# Patient Record
Sex: Male | Born: 1990 | Race: Black or African American | Hispanic: No | Marital: Single | Smoking: Never smoker
Health system: Southern US, Community
[De-identification: ages and names within clinical notes are randomized; demographics above are authoritative.]

## PROBLEM LIST (undated history)

## (undated) DIAGNOSIS — C801 Malignant (primary) neoplasm, unspecified: Secondary | ICD-10-CM

## (undated) DIAGNOSIS — R221 Localized swelling, mass and lump, neck: Secondary | ICD-10-CM

## (undated) HISTORY — DX: Localized swelling, mass and lump, neck: R22.1

---

## 2014-08-24 ENCOUNTER — Ambulatory Visit: Payer: Self-pay | Admitting: Family Medicine

## 2014-09-22 ENCOUNTER — Ambulatory Visit
Admit: 2014-09-22 | Disposition: A | Discharge status: Home / Self Care | Payer: Self-pay | Attending: Oncology | Admitting: Oncology

## 2014-10-06 ENCOUNTER — Ambulatory Visit
Admit: 2014-10-06 | Disposition: A | Discharge status: Home / Self Care | Payer: Self-pay | Attending: Family Medicine | Admitting: Family Medicine

## 2014-10-07 ENCOUNTER — Ambulatory Visit
Admit: 2014-10-07 | Disposition: A | Discharge status: Home / Self Care | Payer: Self-pay | Attending: Oncology | Admitting: Oncology

## 2014-10-11 LAB — COMPREHENSIVE METABOLIC PANEL
Albumin: 4.1 g/dL
Alkaline Phosphatase: 86 U/L
Anion Gap: 8 (ref 7–16)
BUN: 11 mg/dL
Bilirubin,Total: 0.4 mg/dL
Calcium, Total: 9.5 mg/dL
Chloride: 102 mmol/L
Co2: 29 mmol/L
Creatinine: 0.89 mg/dL
EGFR (African American): >60
EGFR (Non-African Amer.): >60
Glucose: 100 mg/dL — ABNORMAL HIGH
Potassium: 4.3 mmol/L
SGOT(AST): 15 U/L
SGPT (ALT): 18 U/L
Sodium: 139 mmol/L
Total Protein: 9 g/dL — ABNORMAL HIGH

## 2014-10-11 LAB — LACTATE DEHYDROGENASE: LDH: 120 U/L

## 2014-10-11 LAB — CBC CANCER CENTER
Basophil #: 0 x10 3/mm (ref 0.0–0.1)
Basophil %: 0.4 %
Eosinophil #: 0.1 x10 3/mm (ref 0.0–0.7)
Eosinophil %: 1.2 %
HCT: 35 % — ABNORMAL LOW (ref 40.0–52.0)
HGB: 11.4 g/dL — ABNORMAL LOW (ref 13.0–18.0)
Lymphocyte #: 1.1 x10 3/mm (ref 1.0–3.6)
Lymphocyte %: 12.7 %
MCH: 23.6 pg — ABNORMAL LOW (ref 26.0–34.0)
MCHC: 32.6 g/dL (ref 32.0–36.0)
MCV: 73 fL — ABNORMAL LOW (ref 80–100)
Monocyte #: 0.6 x10 3/mm (ref 0.2–1.0)
Monocyte %: 6.9 %
Neutrophil #: 6.6 x10 3/mm — ABNORMAL HIGH (ref 1.4–6.5)
Neutrophil %: 78.8 %
Platelet: 317 x10 3/mm (ref 150–440)
RBC: 4.82 10*6/uL (ref 4.40–5.90)
RDW: 15.2 % — ABNORMAL HIGH (ref 11.5–14.5)
WBC: 8.4 x10 3/mm (ref 3.8–10.6)

## 2014-10-11 LAB — PROTIME-INR
INR: 1.2
Prothrombin Time: 15.6 secs — ABNORMAL HIGH

## 2014-10-11 LAB — APTT: Activated PTT: 40.3 secs — ABNORMAL HIGH (ref 23.6–35.9)

## 2014-10-13 ENCOUNTER — Ambulatory Visit
Admit: 2014-10-13 | Disposition: A | Discharge status: Home / Self Care | Payer: Self-pay | Attending: Unknown Physician Specialty | Admitting: Unknown Physician Specialty

## 2014-10-16 ENCOUNTER — Ambulatory Visit
Admit: 2014-10-16 | Disposition: A | Discharge status: Home / Self Care | Payer: Self-pay | Attending: Oncology | Admitting: Oncology

## 2014-10-20 LAB — SURGICAL PATHOLOGY

## 2014-10-25 ENCOUNTER — Encounter: Payer: Self-pay | Admitting: Oncology

## 2014-10-25 ENCOUNTER — Inpatient Hospital Stay: Payer: Commercial Indemnity | Attending: Oncology | Admitting: Oncology

## 2014-10-25 ENCOUNTER — Ambulatory Visit: Payer: Self-pay | Admitting: Oncology

## 2014-10-25 ENCOUNTER — Other Ambulatory Visit: Payer: Self-pay | Admitting: Oncology

## 2014-10-25 VITALS — BP 147/73 | HR 61 | Temp 96.9°F | Resp 18 | Wt 306.2 lb

## 2014-10-25 DIAGNOSIS — Z79899 Other long term (current) drug therapy: Secondary | ICD-10-CM | POA: Diagnosis not present

## 2014-10-25 DIAGNOSIS — C8191 Hodgkin lymphoma, unspecified, lymph nodes of head, face, and neck: Secondary | ICD-10-CM | POA: Diagnosis not present

## 2014-10-25 DIAGNOSIS — R0602 Shortness of breath: Secondary | ICD-10-CM

## 2014-10-25 DIAGNOSIS — Z5111 Encounter for antineoplastic chemotherapy: Secondary | ICD-10-CM | POA: Diagnosis not present

## 2014-10-25 DIAGNOSIS — C8190 Hodgkin lymphoma, unspecified, unspecified site: Secondary | ICD-10-CM

## 2014-10-26 ENCOUNTER — Other Ambulatory Visit: Payer: Self-pay | Admitting: Oncology

## 2014-10-26 DIAGNOSIS — C8190 Hodgkin lymphoma, unspecified, unspecified site: Secondary | ICD-10-CM

## 2014-10-27 NOTE — Progress Notes (Signed)
[place]  [phone] [phone]  ID: Nicholas Ballard. OB: [birth year]  MR#: [id number]  JTT#:[id number]  Patient Care Team: No Pcp Per Patient as PCP - General (General Practice)  CHIEF COMPLAINT:  Chief Complaint  Patient presents with  . Follow-up    neck mass  . Results    INTERVAL HISTORY: Patient returns to clinic today for further evaluation and discussion of his pathology results. He feels the mass on his neck has decreased in size, but is still mildly tender.  He has no neurologic complaints. He denies any fevers, chills, night sweats, or weight loss. He denies any difficulty swallowing or dysphagia. He has no chest pain, shortness of breath, or cough. He denies any nausea, vomiting, constipation, or diarrhea. He has no urinary complaints. Patient otherwise feels well and offers no further specific complaints.   REVIEW OF SYSTEMS:   Review of Systems  Constitutional: Negative.   HENT:       Denies dysphagia.  Respiratory: Negative.     As per HPI. Otherwise, a complete review of systems is negatve.  PAST MEDICAL HISTORY: Past Medical History  Diagnosis Date  . Neck mass     right     PAST SURGICAL HISTORY: No past surgical history on file.  FAMILY HISTORY Family History  Problem Relation Age of Onset  . Cancer Paternal Grandfather     head and neck cancer  . Diabetes Other   . Hypertension Other   . Cancer Other     history of prostate and kidney cancer       ADVANCED DIRECTIVES:    HEALTH MAINTENANCE: History  Substance Use Topics  . Smoking status: Never Smoker   . Smokeless tobacco: Never Used  . Alcohol Use: No     Colonoscopy:  PAP:  Bone density:  Lipid panel:  No Known Allergies  Current Outpatient Prescriptions  Medication Sig Dispense Refill  . acetaminophen (TYLENOL) 500 MG tablet Take 500 mg by mouth every 6 (six) hours as needed.    Marland Kitchen HYDROcodone-acetaminophen (NORCO/VICODIN)  5-325 MG per tablet Take 1 tablet by mouth every 6 (six) hours as needed for moderate pain.     No current facility-administered medications for this visit.    OBJECTIVE: Filed Vitals:   10/25/14 1202  BP: 147/73  Pulse: 61  Temp: 96.9 F (36.1 C)  Resp: 18     Body mass index is 41.48 kg/(m^2).    ECOG FS:0 - Asymptomatic  General: Well-developed, well-nourished, no acute distress. Eyes: Pink conjunctiva, anicteric sclera. HEENT: Easily palpable right neck/supraclavicular mass. Lungs: Clear to auscultation bilaterally. Heart: Regular rate and rhythm. No rubs, murmurs, or gallops. Abdomen: Soft, nontender, nondistended. No organomegaly noted, normoactive bowel sounds. Musculoskeletal: No edema, cyanosis, or clubbing. Neuro: Alert, answering all questions appropriately. Cranial nerves grossly intact. Skin: No rashes or petechiae noted. Psych: Normal affect.    LAB RESULTS:  Lab Results  Component Value Date   NA 139 10/11/2014   K 4.3 10/11/2014   CL 102 10/11/2014   CO2 29 10/11/2014   GLUCOSE 100* 10/11/2014   BUN 11 10/11/2014   CREATININE 0.89 10/11/2014   CALCIUM 9.5 10/11/2014   PROT 9.0* 10/11/2014   ALBUMIN 4.1 10/11/2014   AST 15 10/11/2014   ALT 18 10/11/2014   ALKPHOS 86 10/11/2014   GFRNONAA >60 10/11/2014   GFRAA >60 10/11/2014    Lab Results  Component Value Date   WBC 8.4 10/11/2014   NEUTROABS  6.6* 10/11/2014   HGB 11.4* 10/11/2014   HCT 35.0* 10/11/2014   MCV 73* 10/11/2014   PLT 317 10/11/2014     STUDIES: Nm Pet Image Initial (pi) Skull Base To Thigh  10/16/2014   CLINICAL DATA:  Initial treatment strategy for cervical/thoracic adenopathy. Initial staging for lymphoma.  EXAM: NUCLEAR MEDICINE PET SKULL BASE TO THIGH  TECHNIQUE: 12.2 mCi F-18 FDG was injected intravenously. Full-ring PET imaging was performed from the skull base to thigh after the radiotracer. CT data was obtained and used for attenuation correction and anatomic  localization.  FASTING BLOOD GLUCOSE:  Value: 81 mg/dl  COMPARISON:  Neck CT 10/06/2014.  Chest CT 10/06/2014.  FINDINGS: Mild degradation secondary to patient body habitus.  NECK  Bilateral low cervical nodal hypermetabolism. Hypermetabolism corresponding to the dominant mass at the junction of the low right neck and upper right chest. This measures 7.4 x 9.7 cm and a S.U.V. max of 18.8 on image 66.  CHEST  Mediastinal adenopathy, with an index right paratracheal node measuring 2.3 cm and a S.U.V. max of 11.6.  ABDOMEN/PELVIS  No abdominal nodal hypermetabolism. No abnormal activity within the spleen.  Bilateral mildly prominent inguinal nodes are favored to be reactive. Example left inguinal node which measures 8 mm and a S.U.V. max of 2.3 on image 291.  SKELETON  No abnormal marrow activity.  CT IMAGES PERFORMED FOR ATTENUATION CORRECTION  Mild mucosal thickening of the left maxillary sinus. Mild cardiomegaly.  IMPRESSION: 1. Active lymphoma within the neck and chest, as detailed above. 2. No convincing evidence of subdiaphragmatic disease. Bilateral prominent inguinal nodes with low-level hypermetabolism are favored to be reactive. 3. Mild degradation secondary to patient body habitus.   Electronically Signed   By: Abigail Miyamoto M.D.   On: 10/16/2014 12:44   US Thyroid Biopsy  10/13/2014   CLINICAL DATA:  Right supraclavicular mass.  EXAM: ULTRASOUND GUIDED core BIOPSY OF right supraclavicular mass.  MEDICATIONS: 2.0 mg IV Versed; 50 mcg IV Fentanyl  Total Moderate Sedation Time: 30 minutes.  PROCEDURE: The procedure, risks, benefits, and alternatives were explained to the patient. Questions regarding the procedure were encouraged and answered. The patient understands and consents to the procedure.  The right supraclavicular region was prepped with chlorhexidine in a sterile fashion, and a sterile drape was applied covering the operative field. Sterile gloves were used for the procedure. Local anesthesia was  provided with 1% Lidocaine.  It should be noted, under ultrasound this mass is very ill-defined and exact margins are not readily apparent. Under real-time ultrasound guidance, 3 core samples were obtained using 18 gauge needle and delivered to pathology as touch preps. Then, 3 more core samples were obtained and placed in formalin vial and delivered to pathology. Needle was removed an hemostasis was achieved with manual compression. Appropriate dressing was applied.  COMPLICATIONS: None immediate.  FINDINGS: Complex heterogeneous mass is noted in right supraclavicular region which is somewhat ill-defined on ultrasound.  IMPRESSION: Under real-time ultrasound guidance, percutaneous biopsy of right supraclavicular mass was performed.   Electronically Signed   By: Marijo Conception, M.D.   On: 10/13/2014 09:26    ASSESSMENT: Stage II Hodgkin's lymphoma.  PLAN:    1. Hodgkin's lymphoma: PET scan results as above and reviewed independently. Patient does not have any disease below his diaphragm. We will get a bone marrow biopsy to complete the staging workup. Patient will also require a MUGA as well as PFTs prior to starting chemotherapy. Finally, patient will  require port placement. Return to clinic in approximately 2-3 weeks to initiate cycle 1 of 12 of ABVD chemotherapy.  Approximately 30 minutes was spent in discussion and consultation.  Patient expressed understanding and was in agreement with this plan. He also understands that He can call clinic at any time with any questions, concerns, or complaints.   No matching staging information was found for the patient.  Lloyd Huger, MD   10/27/2014 5:05 PM

## 2014-10-30 ENCOUNTER — Ambulatory Visit: Payer: Managed Care, Other (non HMO) | Attending: Oncology

## 2014-10-30 DIAGNOSIS — R0602 Shortness of breath: Secondary | ICD-10-CM

## 2014-10-30 DIAGNOSIS — C8190 Hodgkin lymphoma, unspecified, unspecified site: Secondary | ICD-10-CM | POA: Diagnosis present

## 2014-11-03 ENCOUNTER — Ambulatory Visit
Admission: RE | Admit: 2014-11-03 | Discharge: 2014-11-03 | Disposition: A | Discharge status: Home / Self Care | Payer: Managed Care, Other (non HMO) | Source: Ambulatory Visit | Attending: Oncology | Admitting: Oncology

## 2014-11-03 DIAGNOSIS — C8190 Hodgkin lymphoma, unspecified, unspecified site: Secondary | ICD-10-CM

## 2014-11-03 HISTORY — DX: Malignant (primary) neoplasm, unspecified: C80.1

## 2014-11-03 MED ORDER — TECHNETIUM TC 99M-LABELED RED BLOOD CELLS IV KIT
20.3200 | PACK | Freq: Once | INTRAVENOUS | Status: AC | PRN
  Administered 2014-11-03: 20.32 via INTRAVENOUS

## 2014-11-03 NOTE — Patient Instructions (Signed)
Doxorubicin injection What is this medicine? DOXORUBICIN (dox oh ROO bi sin) is a chemotherapy drug. It is used to treat many kinds of cancer like Hodgkin's disease, leukemia, non-Hodgkin's lymphoma, neuroblastoma, sarcoma, and Wilms' tumor. It is also used to treat bladder cancer, breast cancer, lung cancer, ovarian cancer, stomach cancer, and thyroid cancer. This medicine may be used for other purposes; ask your health care provider or pharmacist if you have questions. COMMON BRAND NAME(S): Adriamycin, Adriamycin PFS, Adriamycin RDF, Rubex What should I tell my health care provider before I take this medicine? They need to know if you have any of these conditions: -blood disorders -heart disease, recent heart attack -infection (especially a virus infection such as chickenpox, cold sores, or herpes) -irregular heartbeat -liver disease -recent or ongoing radiation therapy -an unusual or allergic reaction to doxorubicin, other chemotherapy agents, other medicines, foods, dyes, or preservatives -pregnant or trying to get pregnant -breast-feeding How should I use this medicine? This drug is given as an infusion into a vein. It is administered in a hospital or clinic by a specially trained health care professional. If you have pain, swelling, burning or any unusual feeling around the site of your injection, tell your health care professional right away. Talk to your pediatrician regarding the use of this medicine in children. Special care may be needed. Overdosage: If you think you have taken too much of this medicine contact a poison control center or emergency room at once. NOTE: This medicine is only for you. Do not share this medicine with others. What if I miss a dose? It is important not to miss your dose. Call your doctor or health care professional if you are unable to keep an appointment. What may interact with this medicine? Do not take this medicine with any of the following  medications: -cisapride -droperidol -halofantrine -pimozide -zidovudine This medicine may also interact with the following medications: -chloroquine -chlorpromazine -clarithromycin -cyclophosphamide -cyclosporine -erythromycin -medicines for depression, anxiety, or psychotic disturbances -medicines for irregular heart beat like amiodarone, bepridil, dofetilide, encainide, flecainide, propafenone, quinidine -medicines for seizures like ethotoin, fosphenytoin, phenytoin -medicines for nausea, vomiting like dolasetron, ondansetron, palonosetron -medicines to increase blood counts like filgrastim, pegfilgrastim, sargramostim -methadone -methotrexate -pentamidine -progesterone -vaccines -verapamil Talk to your doctor or health care professional before taking any of these medicines: -acetaminophen -aspirin -ibuprofen -ketoprofen -naproxen This list may not describe all possible interactions. Give your health care provider a list of all the medicines, herbs, non-prescription drugs, or dietary supplements you use. Also tell them if you smoke, drink alcohol, or use illegal drugs. Some items may interact with your medicine. What should I watch for while using this medicine? Your condition will be monitored carefully while you are receiving this medicine. You will need important blood work done while you are taking this medicine. This drug may make you feel generally unwell. This is not uncommon, as chemotherapy can affect healthy cells as well as cancer cells. Report any side effects. Continue your course of treatment even though you feel ill unless your doctor tells you to stop. Your urine may turn red for a few days after your dose. This is not blood. If your urine is dark or brown, call your doctor. In some cases, you may be given additional medicines to help with side effects. Follow all directions for their use. Call your doctor or health care professional for advice if you get a  fever, chills or sore throat, or other symptoms of a cold or flu. Do not   treat yourself. This drug decreases your body's ability to fight infections. Try to avoid being around people who are sick. This medicine may increase your risk to bruise or bleed. Call your doctor or health care professional if you notice any unusual bleeding. Be careful brushing and flossing your teeth or using a toothpick because you may get an infection or bleed more easily. If you have any dental work done, tell your dentist you are receiving this medicine. Avoid taking products that contain aspirin, acetaminophen, ibuprofen, naproxen, or ketoprofen unless instructed by your doctor. These medicines may hide a fever. Men and women of childbearing age should use effective birth control methods while using taking this medicine. Do not become pregnant while taking this medicine. There is a potential for serious side effects to an unborn child. Talk to your health care professional or pharmacist for more information. Do not breast-feed an infant while taking this medicine. Do not let others touch your urine or other body fluids for 5 days after each treatment with this medicine. Caregivers should wear latex gloves to avoid touching body fluids during this time. There is a maximum amount of this medicine you should receive throughout your life. The amount depends on the medical condition being treated and your overall health. Your doctor will watch how much of this medicine you receive in your lifetime. Tell your doctor if you have taken this medicine before. What side effects may I notice from receiving this medicine? Side effects that you should report to your doctor or health care professional as soon as possible: -allergic reactions like skin rash, itching or hives, swelling of the face, lips, or tongue -low blood counts - this medicine may decrease the number of white blood cells, red blood cells and platelets. You may be at  increased risk for infections and bleeding. -signs of infection - fever or chills, cough, sore throat, pain or difficulty passing urine -signs of decreased platelets or bleeding - bruising, pinpoint red spots on the skin, black, tarry stools, blood in the urine -signs of decreased red blood cells - unusually weak or tired, fainting spells, lightheadedness -breathing problems -chest pain -fast, irregular heartbeat -mouth sores -nausea, vomiting -pain, swelling, redness at site where injected -pain, tingling, numbness in the hands or feet -swelling of ankles, feet, or hands -unusual bleeding or bruising Side effects that usually do not require medical attention (report to your doctor or health care professional if they continue or are bothersome): -diarrhea -facial flushing -hair loss -loss of appetite -missed menstrual periods -nail discoloration or damage -red or watery eyes -red colored urine -stomach upset This list may not describe all possible side effects. Call your doctor for medical advice about side effects. You may report side effects to FDA at 1-800-FDA-1088. Where should I keep my medicine? This drug is given in a hospital or clinic and will not be stored at home. NOTE: This sheet is a summary. It may not cover all possible information. If you have questions about this medicine, talk to your doctor, pharmacist, or health care provider.  2015, [employer]. (2012-10-05 09:54:34) Bleomycin injection What is this medicine? BLEOMYCIN (blee oh MYE sin) is a chemotherapy drug. It is used to treat many kinds of cancer like lymphoma, cervical cancer, head and neck cancer, and testicular cancer. It is also used to prevent and to treat fluid build-up around the lungs caused by some cancers. This medicine may be used for other purposes; ask your health care provider or pharmacist if  you have questions. COMMON BRAND NAME(S): Blenoxane What should I tell my health care  provider before I take this medicine? They need to know if you have any of these conditions: -cigarette smoker -kidney disease -lung disease -recent or ongoing radiation therapy -an unusual or allergic reaction to bleomycin, other chemotherapy agents, other medicines, foods, dyes, or preservatives -pregnant or trying to get pregnant -breast-feeding How should I use this medicine? This drug is given as an infusion into a vein or a body cavity. It can also be given as an injection into a muscle or under the skin. It is administered in a hospital or clinic by a specially trained health care professional. Talk to your pediatrician regarding the use of this medicine in children. Special care may be needed. Overdosage: If you think you have taken too much of this medicine contact a poison control center or emergency room at once. NOTE: This medicine is only for you. Do not share this medicine with others. What if I miss a dose? It is important not to miss your dose. Call your doctor or health care professional if you are unable to keep an appointment. What may interact with this medicine? -certain antibiotics given by injection -cisplatin -cyclosporine -diuretics -foscarnet -medicines to increase blood counts like filgrastim, pegfilgrastim, sargramostim -vaccines This list may not describe all possible interactions. Give your health care provider a list of all the medicines, herbs, non-prescription drugs, or dietary supplements you use. Also tell them if you smoke, drink alcohol, or use illegal drugs. Some items may interact with your medicine. What should I watch for while using this medicine? Visit your doctor for checks on your progress. This drug may make you feel generally unwell. This is not uncommon, as chemotherapy can affect healthy cells as well as cancer cells. Report any side effects. Continue your course of treatment even though you feel ill unless your doctor tells you to  stop. Call your doctor or health care professional for advice if you get a fever, chills or sore throat, or other symptoms of a cold or flu. Do not treat yourself. This drug decreases your body's ability to fight infections. Try to avoid being around people who are sick. Avoid taking products that contain aspirin, acetaminophen, ibuprofen, naproxen, or ketoprofen unless instructed by your doctor. These medicines may hide a fever. Do not become pregnant while taking this medicine. Women should inform their doctor if they wish to become pregnant or think they might be pregnant. There is a potential for serious side effects to an unborn child. Talk to your health care professional or pharmacist for more information. Do not breast-feed an infant while taking this medicine. There is a maximum amount of this medicine you should receive throughout your life. The amount depends on the medical condition being treated and your overall health. Your doctor will watch how much of this medicine you receive in your lifetime. Tell your doctor if you have taken this medicine before. What side effects may I notice from receiving this medicine? Side effects that you should report to your doctor or health care professional as soon as possible: -allergic reactions like skin rash, itching or hives, swelling of the face, lips, or tongue -breathing problems -chest pain -confusion -cough -fast, irregular heartbeat -feeling faint or lightheaded, falls -fever or chills -mouth sores -pain, tingling, numbness in the hands or feet -trouble passing urine or change in the amount of urine -yellowing of the eyes or skin Side effects that usually do not  require medical attention (report to your doctor or health care professional if they continue or are bothersome): -darker skin color -hair loss -irritation at site where injected -loss of appetite -nail changes -nausea and vomiting -weight loss This list may not describe all  possible side effects. Call your doctor for medical advice about side effects. You may report side effects to FDA at 1-800-FDA-1088. Where should I keep my medicine? This drug is given in a hospital or clinic and will not be stored at home. NOTE: This sheet is a summary. It may not cover all possible information. If you have questions about this medicine, talk to your doctor, pharmacist, or health care provider.  2015, [employer]. (2012-10-05 09:36:48) Vinblastine injection What is this medicine? VINBLASTINE (vin BLAS teen) is a chemotherapy drug. It slows the growth of cancer cells. This medicine is used to treat many types of cancer like breast cancer, testicular cancer, Hodgkin's disease, non-Hodgkin's lymphoma, and sarcoma. This medicine may be used for other purposes; ask your health care provider or pharmacist if you have questions. COMMON BRAND NAME(S): Velban What should I tell my health care provider before I take this medicine? They need to know if you have any of these conditions: -blood disorders -dental disease -gout -infection (especially a virus infection such as chickenpox, cold sores, or herpes) -liver disease -lung disease -nervous system disease -recent or ongoing radiation therapy -an unusual or allergic reaction to vinblastine, other chemotherapy agents, other medicines, foods, dyes, or preservatives -pregnant or trying to get pregnant -breast-feeding How should I use this medicine? This drug is given as an infusion into a vein. It is administered in a hospital or clinic by a specially trained health care professional. If you have pain, swelling, burning or any unusual feeling around the site of your injection, tell your health care professional right away. Talk to your pediatrician regarding the use of this medicine in children. While this drug may be prescribed for selected conditions, precautions do apply. Overdosage: If you think you have taken too much  of this medicine contact a poison control center or emergency room at once. NOTE: This medicine is only for you. Do not share this medicine with others. What if I miss a dose? It is important not to miss your dose. Call your doctor or health care professional if you are unable to keep an appointment. What may interact with this medicine? Do not take this medicine with any of the following medications: -erythromycin -itraconazole -mibefradil -voriconazole This medicine may also interact with the following medications: -cyclosporine -fluconazole -ketoconazole -medicines for seizures like phenytoin -medicines to increase blood counts like filgrastim, pegfilgrastim, sargramostim -vaccines -verapamil Talk to your doctor or health care professional before taking any of these medicines: -acetaminophen -aspirin -ibuprofen -ketoprofen -naproxen This list may not describe all possible interactions. Give your health care provider a list of all the medicines, herbs, non-prescription drugs, or dietary supplements you use. Also tell them if you smoke, drink alcohol, or use illegal drugs. Some items may interact with your medicine. What should I watch for while using this medicine? Your condition will be monitored carefully while you are receiving this medicine. You will need important blood work done while you are taking this medicine. This drug may make you feel generally unwell. This is not uncommon, as chemotherapy can affect healthy cells as well as cancer cells. Report any side effects. Continue your course of treatment even though you feel ill unless your doctor tells you to stop. In  some cases, you may be given additional medicines to help with side effects. Follow all directions for their use. Call your doctor or health care professional for advice if you get a fever, chills or sore throat, or other symptoms of a cold or flu. Do not treat yourself. This drug decreases your body's ability to  fight infections. Try to avoid being around people who are sick. This medicine may increase your risk to bruise or bleed. Call your doctor or health care professional if you notice any unusual bleeding. Be careful brushing and flossing your teeth or using a toothpick because you may get an infection or bleed more easily. If you have any dental work done, tell your dentist you are receiving this medicine. Avoid taking products that contain aspirin, acetaminophen, ibuprofen, naproxen, or ketoprofen unless instructed by your doctor. These medicines may hide a fever. Do not become pregnant while taking this medicine. Women should inform their doctor if they wish to become pregnant or think they might be pregnant. There is a potential for serious side effects to an unborn child. Talk to your health care professional or pharmacist for more information. Do not breast-feed an infant while taking this medicine. Men may have a lower sperm count while taking this medicine. Talk to your doctor if you plan to father a child. What side effects may I notice from receiving this medicine? Side effects that you should report to your doctor or health care professional as soon as possible: -allergic reactions like skin rash, itching or hives, swelling of the face, lips, or tongue -low blood counts - This drug may decrease the number of white blood cells, red blood cells and platelets. You may be at increased risk for infections and bleeding. -signs of infection - fever or chills, cough, sore throat, pain or difficulty passing urine -signs of decreased platelets or bleeding - bruising, pinpoint red spots on the skin, black, tarry stools, nosebleeds -signs of decreased red blood cells - unusually weak or tired, fainting spells, lightheadedness -breathing problems -changes in hearing -change in the amount of urine -chest pain -high blood pressure -mouth sores -nausea and vomiting -pain, swelling, redness or irritation  at the injection site -pain, tingling, numbness in the hands or feet -problems with balance, dizziness -seizures Side effects that usually do not require medical attention (report to your doctor or health care professional if they continue or are bothersome): -constipation -hair loss -jaw pain -loss of appetite -sensitivity to light -stomach pain -tumor pain This list may not describe all possible side effects. Call your doctor for medical advice about side effects. You may report side effects to FDA at 1-800-FDA-1088. Where should I keep my medicine? This drug is given in a hospital or clinic and will not be stored at home. NOTE: This sheet is a summary. It may not cover all possible information. If you have questions about this medicine, talk to your doctor, pharmacist, or health care provider.  2015, [employer]. (2008-03-06 17:15:59) Dacarbazine, DTIC injection What is this medicine? DACARBAZINE (da KAR ba zeen) is a chemotherapy drug. This medicine is used to treat skin cancer. It is also used with other medicines to treat Hodgkin's disease. This medicine may be used for other purposes; ask your health care provider or pharmacist if you have questions. COMMON BRAND NAME(S): DTIC-Dome What should I tell my health care provider before I take this medicine? They need to know if you have any of these conditions: -infection (especially virus infection such as  chickenpox, cold sores, or herpes) -kidney disease -liver disease -low blood counts like low platelets, red blood cells, white blood cells -recent radiation therapy -an unusual or allergic reaction to dacarbazine, other chemotherapy agents, other medicines, foods, dyes, or preservatives -pregnant or trying to get pregnant -breast-feeding How should I use this medicine? This drug is given as an injection or infusion into a vein. It is administered in a hospital or clinic by a specially trained health care  professional. Talk to your pediatrician regarding the use of this medicine in children. While this drug may be prescribed for selected conditions, precautions do apply. Overdosage: If you think you have taken too much of this medicine contact a poison control center or emergency room at once. NOTE: This medicine is only for you. Do not share this medicine with others. What if I miss a dose? It is important not to miss your dose. Call your doctor or health care professional if you are unable to keep an appointment. What may interact with this medicine? -medicines to increase blood counts like filgrastim, pegfilgrastim, sargramostim -vaccines This list may not describe all possible interactions. Give your health care provider a list of all the medicines, herbs, non-prescription drugs, or dietary supplements you use. Also tell them if you smoke, drink alcohol, or use illegal drugs. Some items may interact with your medicine. What should I watch for while using this medicine? Your condition will be monitored carefully while you are receiving this medicine. You will need important blood work done while you are taking this medicine. This drug may make you feel generally unwell. This is not uncommon, as chemotherapy can affect healthy cells as well as cancer cells. Report any side effects. Continue your course of treatment even though you feel ill unless your doctor tells you to stop. Call your doctor or health care professional for advice if you get a fever, chills or sore throat, or other symptoms of a cold or flu. Do not treat yourself. This drug decreases your body's ability to fight infections. Try to avoid being around people who are sick. This medicine may increase your risk to bruise or bleed. Call your doctor or health care professional if you notice any unusual bleeding. Be careful brushing and flossing your teeth or using a toothpick because you may get an infection or bleed more easily. If you  have any dental work done, tell your dentist you are receiving this medicine. Avoid taking products that contain aspirin, acetaminophen, ibuprofen, naproxen, or ketoprofen unless instructed by your doctor. These medicines may hide a fever. Do not become pregnant while taking this medicine. Women should inform their doctor if they wish to become pregnant or think they might be pregnant. There is a potential for serious side effects to an unborn child. Talk to your health care professional or pharmacist for more information. Do not breast-feed an infant while taking this medicine. What side effects may I notice from receiving this medicine? Side effects that you should report to your doctor or health care professional as soon as possible: -allergic reactions like skin rash, itching or hives, swelling of the face, lips, or tongue -low blood counts - this medicine may decrease the number of white blood cells, red blood cells and platelets. You may be at increased risk for infections and bleeding. -signs of infection - fever or chills, cough, sore throat, pain or difficulty passing urine -signs of decreased platelets or bleeding - bruising, pinpoint red spots on the skin, black, tarry stools,  blood in the urine -signs of decreased red blood cells - unusually weak or tired, fainting spells, lightheadedness -breathing problems -muscle pains -pain at site where injected -trouble passing urine or change in the amount of urine -vomiting -yellowing of the eyes or skin Side effects that usually do not require medical attention (report to your doctor or health care professional if they continue or are bothersome): -diarrhea -hair loss -loss of appetite -nausea -skin more sensitive to sun or ultraviolet light -stomach upset This list may not describe all possible side effects. Call your doctor for medical advice about side effects. You may report side effects to FDA at 1-800-FDA-1088. Where should I keep  my medicine? This drug is given in a hospital or clinic and will not be stored at home. NOTE: This sheet is a summary. It may not cover all possible information. If you have questions about this medicine, talk to your doctor, pharmacist, or health care provider.  2015, [employer]. (2007-09-28 16:56:39)

## 2014-11-07 ENCOUNTER — Other Ambulatory Visit: Payer: Self-pay | Admitting: Radiology

## 2014-11-07 ENCOUNTER — Other Ambulatory Visit: Payer: Self-pay | Admitting: Oncology

## 2014-11-07 ENCOUNTER — Inpatient Hospital Stay: Payer: Commercial Indemnity

## 2014-11-07 DIAGNOSIS — C8190 Hodgkin lymphoma, unspecified, unspecified site: Secondary | ICD-10-CM | POA: Insufficient documentation

## 2014-11-08 ENCOUNTER — Other Ambulatory Visit: Payer: Self-pay | Admitting: *Deleted

## 2014-11-08 ENCOUNTER — Ambulatory Visit
Admission: RE | Admit: 2014-11-08 | Discharge: 2014-11-08 | Disposition: A | Discharge status: Home / Self Care | Payer: Managed Care, Other (non HMO) | Source: Ambulatory Visit | Attending: Oncology | Admitting: Oncology

## 2014-11-08 DIAGNOSIS — C8190 Hodgkin lymphoma, unspecified, unspecified site: Secondary | ICD-10-CM | POA: Insufficient documentation

## 2014-11-08 LAB — DIFFERENTIAL
BAND NEUTROPHILS: 0 % (ref 0–10)
Basophils Absolute: 0 10*3/uL (ref 0–0.1)
Basophils Relative: 0 %
Blasts: 0 %
EOS ABS: 0.3 10*3/uL (ref 0.0–0.7)
EOS PCT: 3 % (ref 0–5)
Lymphocytes Relative: 10 % — ABNORMAL LOW (ref 12–46)
Lymphs Abs: 1 10*3/uL (ref 0.7–4.0)
MONO ABS: 0.5 10*3/uL (ref 0.1–1.0)
MONOS PCT: 5 % (ref 3–12)
Metamyelocytes Relative: 0 %
Myelocytes: 0 %
NEUTROS ABS: 8.2 10*3/uL — AB (ref 1.7–7.7)
NEUTROS PCT: 82 % — AB (ref 43–77)
OTHER: 0 %
PROMYELOCYTES ABS: 0 %
nRBC: 0 /100 WBC

## 2014-11-08 LAB — APTT: APTT: 43 s — AB (ref 24–36)

## 2014-11-08 LAB — CBC
HCT: 32.7 % — ABNORMAL LOW (ref 40.0–52.0)
Hemoglobin: 10.3 g/dL — ABNORMAL LOW (ref 13.0–18.0)
MCH: 22.7 pg — AB (ref 26.0–34.0)
MCHC: 31.4 g/dL — ABNORMAL LOW (ref 32.0–36.0)
MCV: 72.3 fL — AB (ref 80.0–100.0)
Platelets: 302 10*3/uL (ref 150–440)
RBC: 4.52 MIL/uL (ref 4.40–5.90)
RDW: 15.6 % — ABNORMAL HIGH (ref 11.5–14.5)
WBC: 10 10*3/uL (ref 3.8–10.6)

## 2014-11-08 LAB — PROTIME-INR
INR: 1.2
Prothrombin Time: 15.4 seconds — ABNORMAL HIGH (ref 11.4–15.0)

## 2014-11-08 MED ORDER — MIDAZOLAM HCL 5 MG/5ML IJ SOLN
INTRAMUSCULAR | Status: AC
  Administered 2014-11-08: 1 mg
  Filled 2014-11-08: qty 10

## 2014-11-08 MED ORDER — FENTANYL CITRATE (PF) 100 MCG/2ML IJ SOLN
INTRAMUSCULAR | Status: AC
  Administered 2014-11-08: 100 ug
  Filled 2014-11-08: qty 2

## 2014-11-08 MED ORDER — FENTANYL CITRATE (PF) 100 MCG/2ML IJ SOLN
INTRAMUSCULAR | Status: AC
  Administered 2014-11-08: 50 ug
  Filled 2014-11-08: qty 2

## 2014-11-08 MED ORDER — HEPARIN SOD (PORK) LOCK FLUSH 100 UNIT/ML IV SOLN
500.0000 [IU] | Freq: Once | INTRAVENOUS | Status: AC
  Administered 2014-11-08: 500 [IU] via INTRAVENOUS
  Filled 2014-11-08: qty 5

## 2014-11-08 MED ORDER — HYDROCODONE-ACETAMINOPHEN 5-325 MG PO TABS: 1.0000 | ORAL_TABLET | Freq: Four times a day (QID) | ORAL | Status: DC | PRN

## 2014-11-08 MED ORDER — MIDAZOLAM HCL 5 MG/ML IJ SOLN
INTRAMUSCULAR | Status: DC | PRN
  Administered 2014-11-08: 1 mg via INTRAVENOUS

## 2014-11-08 MED ORDER — SODIUM CHLORIDE 0.9 % IV SOLN
INTRAVENOUS | Status: DC
Start: 2014-11-08 — End: 2014-11-09
  Administered 2014-11-08: 08:00:00 via INTRAVENOUS

## 2014-11-08 MED ORDER — HEPARIN SOD (PORK) LOCK FLUSH 100 UNIT/ML IV SOLN
INTRAVENOUS | Status: AC
  Filled 2014-11-08: qty 5

## 2014-11-08 NOTE — OR Nursing (Signed)
Time out completed

## 2014-11-08 NOTE — Discharge Instructions (Signed)
Change bandaid on back as needed.  Limit your activity for the next two days after your procedure.  Avoid stooping, bending, heavy lifting or exertion as this may cause bleeding at the site.  Resume normal activities in 48 hours.  You may shower after 24 hours but avoid excessive warm water and do not scrub the site.  Remove in 48 hours.  do not soak in a tub bath or a hot tub for at least one week.  No driving for 48 hours after discharge.  After the procedure, check the insertion site occasionally.  If any oozing occurs or there is apparent swelling, firm pressure over the site will prevent a bruise from forming.  You can not hurt anything by pressing directly on the site.  The pressure stops the bleeding by allowing a small clot to form.  If the bleeding continues after the pressure has been applied for more than 15 minutes, call 911 or go to the nearest emergency room.      You may resume you regular diet.    For pain at the site of your procedure, take non-aspirin medicines such as Tylenol.  Medications:   B. Continue taking all your present medications at home unless your doctor prescribes any changes.

## 2014-11-08 NOTE — Procedures (Signed)
CT guided bone marrow aspiration and core biopsy.  No immediate complication.  Minimal blood loss.

## 2014-11-08 NOTE — H&P (Signed)
Chief Complaint: Scheduled for CT guided bone marrow biopsy.  Referring Physician(s): Finnegan,Timothy J  History of Present Illness: Nicholas Ballard. is a 24 y.o. male with diagnosis of Hodgkins Lymphoma.  He is scheduled for a CT guided bone marrow biopsy.  Patient recently had an US guided neck biopsy.  He is having some mild oozing from the biopsy site.  Otherwise, the patient is asymptomatic.  Past Medical History  Diagnosis Date  . Neck mass     right   . Cancer     Hodgkins Lymphoma    History reviewed. No pertinent past surgical history.  Allergies: Review of patient's allergies indicates no known allergies.  Medications: Prior to Admission medications   Medication Sig Start Date End Date Taking? Authorizing Provider  acetaminophen (TYLENOL) 500 MG tablet Take 500 mg by mouth every 6 (six) hours as needed.    Historical Provider, MD  HYDROcodone-acetaminophen (NORCO/VICODIN) 5-325 MG per tablet Take 1 tablet by mouth every 6 (six) hours as needed for moderate pain.    Historical Provider, MD     Family History  Problem Relation Age of Onset  . Cancer Paternal Grandfather     head and neck cancer  . Diabetes Other   . Hypertension Other   . Cancer Other     history of prostate and kidney cancer    History   Social History  . Marital Status: Single    Spouse Name: N/A  . Number of Children: N/A  . Years of Education: N/A   Social History Main Topics  . Smoking status: Never Smoker   . Smokeless tobacco: Never Used  . Alcohol Use: Yes  . Drug Use: No  . Sexual Activity: Not on file   Other Topics Concern  . None   Social History Narrative    ECOG Status: 0 - Asymptomatic   Review of Systems  Constitutional: Negative.   Respiratory: Negative.   Cardiovascular: Negative.   Gastrointestinal: Negative.   Genitourinary: Negative.     Vital Signs: Ht 6' (1.829 m)  Wt 310 lb (140.615 kg)  BMI 42.03 kg/m2  Physical Exam    Constitutional: He appears well-developed and well-nourished.  Cardiovascular: Normal rate, regular rhythm and normal heart sounds.   Pulmonary/Chest: Effort normal and breath sounds normal.    Mallampati Score: 1  MD Evaluation Airway: WNL Heart: WNL Chest/ Lungs: WNL ASA  Classification: 1  Imaging: Nm Cardiac Muga Rest  11/03/2014   CLINICAL DATA:  Hodgkin's lymphoma, cardiotoxic chemotherapy  EXAM: NUCLEAR MEDICINE CARDIAC BLOOD POOL IMAGING (MUGA)  TECHNIQUE: Cardiac multi-gated acquisition was performed at rest following intravenous injection of Tc-72mlabeled red blood cells.  RADIOPHARMACEUTICALS:  20.32 mCi Technetium-986mn-vitro labeled autologous red blood cells IV  COMPARISON:  None  FINDINGS: LEFT ventricular ejection fraction is calculated at 64%, normal.  Exam was performed at a heart rate of 64 beats per minute.  Cine analysis of the gated blood pool in 3 projections demonstrates normal LEFT ventricular wall motion.  IMPRESSION: Normal LEFT ventricular ejection fraction of 64%.  Normal LV wall motion.   Electronically Signed   By: MaLavonia Dana.D.   On: 11/03/2014 10:46   Nm Pet Image Initial (pi) Skull Base To Thigh  10/16/2014   CLINICAL DATA:  Initial treatment strategy for cervical/thoracic adenopathy. Initial staging for lymphoma.  EXAM: NUCLEAR MEDICINE PET SKULL BASE TO THIGH  TECHNIQUE: 12.2 mCi F-18 FDG was injected intravenously. Full-ring PET imaging was performed  from the skull base to thigh after the radiotracer. CT data was obtained and used for attenuation correction and anatomic localization.  FASTING BLOOD GLUCOSE:  Value: 81 mg/dl  COMPARISON:  Neck CT 10/06/2014.  Chest CT 10/06/2014.  FINDINGS: Mild degradation secondary to patient body habitus.  NECK  Bilateral low cervical nodal hypermetabolism. Hypermetabolism corresponding to the dominant mass at the junction of the low right neck and upper right chest. This measures 7.4 x 9.7 cm and a S.U.V. max of 18.8  on image 66.  CHEST  Mediastinal adenopathy, with an index right paratracheal node measuring 2.3 cm and a S.U.V. max of 11.6.  ABDOMEN/PELVIS  No abdominal nodal hypermetabolism. No abnormal activity within the spleen.  Bilateral mildly prominent inguinal nodes are favored to be reactive. Example left inguinal node which measures 8 mm and a S.U.V. max of 2.3 on image 291.  SKELETON  No abnormal marrow activity.  CT IMAGES PERFORMED FOR ATTENUATION CORRECTION  Mild mucosal thickening of the left maxillary sinus. Mild cardiomegaly.  IMPRESSION: 1. Active lymphoma within the neck and chest, as detailed above. 2. No convincing evidence of subdiaphragmatic disease. Bilateral prominent inguinal nodes with low-level hypermetabolism are favored to be reactive. 3. Mild degradation secondary to patient body habitus.   Electronically Signed   By: Abigail Miyamoto M.D.   On: 10/16/2014 12:44   US Thyroid Biopsy  10/13/2014   CLINICAL DATA:  Right supraclavicular mass.  EXAM: ULTRASOUND GUIDED core BIOPSY OF right supraclavicular mass.  MEDICATIONS: 2.0 mg IV Versed; 50 mcg IV Fentanyl  Total Moderate Sedation Time: 30 minutes.  PROCEDURE: The procedure, risks, benefits, and alternatives were explained to the patient. Questions regarding the procedure were encouraged and answered. The patient understands and consents to the procedure.  The right supraclavicular region was prepped with chlorhexidine in a sterile fashion, and a sterile drape was applied covering the operative field. Sterile gloves were used for the procedure. Local anesthesia was provided with 1% Lidocaine.  It should be noted, under ultrasound this mass is very ill-defined and exact margins are not readily apparent. Under real-time ultrasound guidance, 3 core samples were obtained using 18 gauge needle and delivered to pathology as touch preps. Then, 3 more core samples were obtained and placed in formalin vial and delivered to pathology. Needle was removed an  hemostasis was achieved with manual compression. Appropriate dressing was applied.  COMPLICATIONS: None immediate.  FINDINGS: Complex heterogeneous mass is noted in right supraclavicular region which is somewhat ill-defined on ultrasound.  IMPRESSION: Under real-time ultrasound guidance, percutaneous biopsy of right supraclavicular mass was performed.   Electronically Signed   By: Marijo Conception, M.D.   On: 10/13/2014 09:26    Labs:  CBC:  Recent Labs  10/11/14 0931 11/08/14 0736  WBC 8.4 10.0  HGB 11.4* 10.3*  HCT 35.0* 32.7*  PLT 317 302    COAGS:  Recent Labs  10/11/14 0931 11/08/14 0735  INR 1.2 1.20  APTT 40.3* 43*    BMP:  Recent Labs  10/11/14 0931  NA 139  K 4.3  CL 102  CO2 29  GLUCOSE 100*  BUN 11  CALCIUM 9.5  CREATININE 0.89  GFRNONAA >60  GFRAA >60    LIVER FUNCTION TESTS:  Recent Labs  10/11/14 0931  AST 15  ALT 18  ALKPHOS 86  PROT 9.0*  ALBUMIN 4.1    Assessment and Plan:  24 yo with Hodgkins Lymphoma and scheduled for CT guided bone marrow biopsy.  Risks and benefits discussed with patient and informed consent obtained.  Plan for moderate sedation.     SignedCarylon Perches 11/08/2014, 9:51 AM

## 2014-11-09 ENCOUNTER — Ambulatory Visit
Admission: RE | Admit: 2014-11-09 | Discharge: 2014-11-09 | Disposition: A | Discharge status: Home / Self Care | Payer: Managed Care, Other (non HMO) | Source: Ambulatory Visit | Attending: Vascular Surgery | Admitting: Vascular Surgery

## 2014-11-09 ENCOUNTER — Encounter
Admission: RE | Disposition: A | Discharge status: Home / Self Care | Payer: Managed Care, Other (non HMO) | Source: Ambulatory Visit | Attending: Vascular Surgery

## 2014-11-09 ENCOUNTER — Encounter: Payer: Self-pay | Admitting: *Deleted

## 2014-11-09 DIAGNOSIS — C8190 Hodgkin lymphoma, unspecified, unspecified site: Secondary | ICD-10-CM | POA: Diagnosis present

## 2014-11-09 DIAGNOSIS — R509 Fever, unspecified: Secondary | ICD-10-CM | POA: Insufficient documentation

## 2014-11-09 DIAGNOSIS — Z79899 Other long term (current) drug therapy: Secondary | ICD-10-CM | POA: Diagnosis not present

## 2014-11-09 HISTORY — PX: PERIPHERAL VASCULAR CATHETERIZATION: SHX172C

## 2014-11-09 SURGERY — PORTA CATH INSERTION
Anesthesia: Moderate Sedation

## 2014-11-09 MED ORDER — FENTANYL CITRATE (PF) 100 MCG/2ML IJ SOLN
INTRAMUSCULAR | Status: AC
  Filled 2014-11-09: qty 2

## 2014-11-09 MED ORDER — HEPARIN SODIUM (PORCINE) 1000 UNIT/ML IJ SOLN
INTRAMUSCULAR | Status: AC
  Filled 2014-11-09: qty 1

## 2014-11-09 MED ORDER — HEPARIN SODIUM (PORCINE) 1000 UNIT/ML IJ SOLN
INTRAMUSCULAR | Status: DC | PRN
  Administered 2014-11-09: 3000 [IU] via INTRAVENOUS

## 2014-11-09 MED ORDER — LIDOCAINE-EPINEPHRINE (PF) 1 %-1:200000 IJ SOLN
INTRAMUSCULAR | Status: AC
  Filled 2014-11-09: qty 30

## 2014-11-09 MED ORDER — HYDROMORPHONE HCL 1 MG/ML IJ SOLN: 1.0000 mg | INTRAMUSCULAR | Status: DC | PRN

## 2014-11-09 MED ORDER — ONDANSETRON HCL 4 MG/2ML IJ SOLN: 4.0000 mg | INTRAMUSCULAR | Status: DC | PRN

## 2014-11-09 MED ORDER — FENTANYL CITRATE (PF) 100 MCG/2ML IJ SOLN
INTRAMUSCULAR | Status: DC | PRN
  Administered 2014-11-09: 50 ug via INTRAVENOUS
  Administered 2014-11-09: 25 ug via INTRAVENOUS
  Administered 2014-11-09: 50 ug via INTRAVENOUS
  Administered 2014-11-09: 25 ug via INTRAVENOUS

## 2014-11-09 MED ORDER — MIDAZOLAM HCL 5 MG/5ML IJ SOLN
INTRAMUSCULAR | Status: AC
  Filled 2014-11-09: qty 5

## 2014-11-09 MED ORDER — ATROPINE SULFATE 0.1 MG/ML IJ SOLN: 0.5000 mg | Freq: Once | INTRAMUSCULAR | Status: DC | PRN

## 2014-11-09 MED ORDER — MIDAZOLAM HCL 2 MG/2ML IJ SOLN
INTRAMUSCULAR | Status: DC | PRN
  Administered 2014-11-09: 2 mg via INTRAVENOUS
  Administered 2014-11-09: 1 mg via INTRAVENOUS
  Administered 2014-11-09: 2 mg via INTRAVENOUS
  Administered 2014-11-09: 1 mg via INTRAVENOUS

## 2014-11-09 MED ORDER — CEFAZOLIN SODIUM 1-5 GM-% IV SOLN
1.0000 g | Freq: Once | INTRAVENOUS | Status: AC
  Administered 2014-11-09: 1 g via INTRAVENOUS

## 2014-11-09 MED ORDER — MIDAZOLAM HCL 2 MG/2ML IJ SOLN
INTRAMUSCULAR | Status: AC
  Filled 2014-11-09: qty 2

## 2014-11-09 MED ORDER — HEPARIN (PORCINE) IN NACL 2-0.9 UNIT/ML-% IJ SOLN
INTRAMUSCULAR | Status: AC
  Filled 2014-11-09: qty 500

## 2014-11-09 MED ORDER — SODIUM CHLORIDE 0.9 % IV SOLN
INTRAVENOUS | Status: DC
  Administered 2014-11-09: 09:00:00 via INTRAVENOUS

## 2014-11-09 MED ORDER — SODIUM CHLORIDE 0.9 % IR SOLN
80.0000 mg | Freq: Once | Status: DC
  Filled 2014-11-09: qty 2

## 2014-11-09 MED ORDER — LIDOCAINE HCL (PF) 1 % IJ SOLN
INTRAMUSCULAR | Status: DC | PRN
  Administered 2014-11-09: 10 mL

## 2014-11-09 SURGICAL SUPPLY — 21 items
BALLN ARMADA 6X80X80 (BALLOONS) ×2
BALLN ULTRVRSE 018 5X40X150 (BALLOONS) ×2
BALLN ULTRVRSE 8X80X75 (BALLOONS) ×2
BALLOON ARMADA 6.0X80X150 (BALLOONS) ×2 IMPLANT
BALLOON ARMADA 6X80X80 (BALLOONS) ×1 IMPLANT
BALLOON ULTRVRSE 018 5X40X150 (BALLOONS) ×1 IMPLANT
BALLOON ULTRVRSE 8X80X75 (BALLOONS) ×1 IMPLANT
CANISTER SUCT 1200ML W/VALVE (MISCELLANEOUS) ×2 IMPLANT
CANNULA NASAL 8 HUDSON (TUBING) ×2 IMPLANT
CATH CXI SUPP ST 4FR 90CM (MICROCATHETER) ×2 IMPLANT
CATH SLIP KMP 65CM 5FR (CATHETERS) ×2 IMPLANT
DEVICE PRESTO INFLATION (MISCELLANEOUS) ×2 IMPLANT
DEVICE TORQUE (MISCELLANEOUS) ×2 IMPLANT
GLIDEWIRE STIFF .35X180X3 HYDR (WIRE) ×2 IMPLANT
GUIDEWIRE AMPLATZ SHORT (WIRE) ×2 IMPLANT
GUIDEWIRE PFTE-COATED .018X300 (WIRE) ×2 IMPLANT
PACK ANGIOGRAPHY (CUSTOM PROCEDURE TRAY) ×2 IMPLANT
PAD GROUND ADULT SPLIT (MISCELLANEOUS) ×2 IMPLANT
PORTACATH POWER 8F (Port) ×2 IMPLANT
SHEATH BRITE TIP 5FRX11 (SHEATH) ×2 IMPLANT
TOWEL OR 17X26 4PK STRL BLUE (TOWEL DISPOSABLE) ×2 IMPLANT

## 2014-11-09 NOTE — H&P (Signed)
[facility] History & Physical Update  The patient was interviewed and re-examined.  The patient's previous History and Physical has been reviewed and is unchanged.  There is no change in the plan of care.  DEW,JASON, MD  11/09/2014, 8:21 AM

## 2014-11-09 NOTE — Discharge Instructions (Signed)
WATCH FOR SIGNS OF BLEEDING AND INFECTION SUCH AS; BLEEDING, SWELLING, REDNESS, PAIN THAT IS GETTING WORSE OR DOES NOT GET BETTER WITH MEDICATION, CLOUDY OR YELLOW DRAINAGE AND FEVERS  YOU MAY SHOWER TOMORROW   YOU MAY DRIVE TOMORROW  AVOID CONSUMING OR DRINKING ANY ALCOHOL TODAY  NO LIFTING OVER 10 POUNDS OVER THE NEXT TWO DAYS

## 2014-11-09 NOTE — Op Note (Signed)
Preoperative diagnosis:  1. Hodgkins Lymphoma  Postoperative diagnosis:  1.  Hodgkins Lymphoma 2. Central venous occlusion  Procedures: #1. Ultrasound guidance for vascular access to the left internal jugular vein. #2. Fluoroscopic guidance for placement of catheter. #3. Placement of CT compatible Port-A-Cath, left internal jugular vein. #4. Left jugular venogram and central venogram #5. Catheter placement into SVC and azygous system #6. PTA of innominate vein with 6 and 8 mm diameter angioplasty balloon  Surgeon: Leotis Pain, MD.   Anesthesia: Local with moderate conscious sedation.  Fluoroscopy time: 17 minutes  Contrast used: 62 cc  Estimated blood loss: 50 cc  Indication for the procedure:  The patient has a recently diagnosed Hodgkins lymphoma.  The patient needs a Port-A-Cath for durable venous access, chemotherapy, lab draws, and CT scans. We are asked to place this. Risks and benefits were discussed and informed consent was obtained.  Description of procedure: The patient was brought to the vascular and interventional radiology suite. The left neck chest and shoulder were sterilely prepped and draped, and a sterile surgical field was created. Ultrasound was used to help visualize a patent left internal jugular vein. This was then accessed under direct ultrasound guidance without difficulty with the Seldinger needle and a permanent image was recorded. A J-wire was placed but would not pass into the central venous circulation. 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 With a moderate amount of difficulty I was able to cross this venous occlusion ultimately with a CXI catheter and a Glidewire after trying other wires and catheters. I was able to get a wire down into what was then azygous collateral in the  abdomen after demonstrating venous flow in the superior vena cava and right atrium. I exchanged through the [place] catheter for a 0.018 wire as the 035 balloon would not track across the innominate occlusion. I then balloon angioplastied the left innominate vein with a 6 and an 8 mm diameter angioplasty balloon. This resulted in still about a residual 50 or 60% stenosis, but flow was seen and I was then able to track a catheter across this into a larger vein in the chest which would support a Port-A-Cath. I exchanged for the peel-away sheath. After skin nick and dilatation, the peel-away sheath was then placed over the wire. I then anesthetized an area under the clavicle approximately 1-2 fingerbreadths. A transverse incision was created and an inferior pocket was created with electrocautery and blunt dissection. The port was then brought onto the field, placed into the pocket. The catheter was connected to the port and tunneled from the subclavicular incision to the access site. Fluoroscopic guidance was then used to cut the catheter to an appropriate length. The catheter was then placed through the peel-away sheath and the peel-away sheath was removed. The catheter tip was parked in a central vein that may have been in the azygous system or the superior vena cava, and it was difficult to determine which. We actually did an injection and this was difficult to determine. The catheter did withdraw blood well and flush easily with heparin saline and so I elected to leave it for durable venous access. The pocket was then irrigated with antibiotic impregnated saline and the wound was closed with a running 3-0 Vicryl and a 4-0 Monocryl. The access incision was closed with a single 4-0  Monocryl. The Huber needle was used to withdraw blood and flush the port with heparinized saline. Dermabond was then placed as a dressing. The patient tolerated the procedure well and was taken to the recovery room in stable  condition.   DEW,JASON

## 2014-11-09 NOTE — OR Nursing (Signed)
Thermon Leyland RN, to give meds and monitor

## 2014-11-10 ENCOUNTER — Telehealth: Payer: Self-pay | Admitting: *Deleted

## 2014-11-10 ENCOUNTER — Encounter: Payer: Self-pay | Admitting: Oncology

## 2014-11-10 MED ORDER — LIDOCAINE-PRILOCAINE 2.5-2.5 % EX CREA: TOPICAL_CREAM | CUTANEOUS | Status: DC

## 2014-11-10 NOTE — Telephone Encounter (Signed)
Prescription sent

## 2014-11-13 ENCOUNTER — Encounter: Payer: Self-pay | Admitting: Vascular Surgery

## 2014-11-13 NOTE — H&P (Signed)
[facility] Admission History & Physical  MRN : [id number]  Nicholas Ballard. is a 24 y.o. (07-27-1990) male who presents with chief complaint of recently diagnosed lymphoma.  History of Present Illness: Patient presents for outpatient placement of a Port-A-Cath for chemotherapy and durable venous access. He has had a lump in his right neck that was recently diagnosed as Hodgkin's lymphoma. He is scheduled to begin chemotherapy as soon as possible.  No current facility-administered medications for this encounter.   Current Outpatient Prescriptions  Medication Sig Dispense Refill  . acetaminophen (TYLENOL) 500 MG tablet Take 500 mg by mouth every 6 (six) hours as needed.    Marland Kitchen HYDROcodone-acetaminophen (NORCO/VICODIN) 5-325 MG per tablet Take 1 tablet by mouth every 6 (six) hours as needed for moderate pain. 30 tablet 0  . lidocaine-prilocaine (EMLA) cream Apply to port area at least 1 hour prior to being accessed 30 g 0    Past Medical History  Diagnosis Date  . Neck mass     right   . Cancer     Hodgkins Lymphoma    Past Surgical History  Procedure Laterality Date  . Peripheral vascular catheterization N/A 11/09/2014    Procedure: Glori Luis Cath Insertion;  Surgeon: Algernon Huxley, MD;  Location: [facility];  Service: Cardiovascular;  Laterality: N/A;    Social History History  Substance Use Topics  . Smoking status: Never Smoker   . Smokeless tobacco: Never Used  . Alcohol Use: Yes     Comment: occasional    Family History Family History  Problem Relation Age of Onset  . Cancer Paternal Grandfather     head and neck cancer  . Diabetes Other   . Hypertension Other   . Cancer Other     history of prostate and kidney cancer   no bleeding or clotting disorders to his knowledge  No Known Allergies   REVIEW OF SYSTEMS (Negative unless checked)  Constitutional: [] Weight loss  [x] Fever  [] Chills Cardiac: [] Chest pain   [] Chest  pressure   [] Palpitations   [] Shortness of breath when laying flat   [] Shortness of breath at rest   [] Shortness of breath with exertion. Vascular:  [] Pain in legs with walking   [] Pain in legs at rest   [] Pain in legs when laying flat   [] Claudication   [] Pain in feet when walking  [] Pain in feet at rest  [] Pain in feet when laying flat   [] History of DVT   [] Phlebitis   [] Swelling in legs   [] Varicose veins   [] Non-healing ulcers Pulmonary:   [] Uses home oxygen   [] Productive cough   [] Hemoptysis   [] Wheeze  [] COPD   [] Asthma Neurologic:  [] Dizziness  [] Blackouts   [] Seizures   [] History of stroke   [] History of TIA  [] Aphasia   [] Temporary blindness   [] Dysphagia   [] Weakness or numbness in arms   [] Weakness or numbness in legs Musculoskeletal:  [] Arthritis   [] Joint swelling   [] Joint pain   [] Low back pain Hematologic:  [] Easy bruising  [] Easy bleeding   [] Hypercoagulable state   [] Anemic  [] Hepatitis Gastrointestinal:  [] Blood in stool   [] Vomiting blood  [] Gastroesophageal reflux/heartburn   [] Difficulty swallowing. Genitourinary:  [] Chronic kidney disease   [] Difficult urination  [] Frequent urination  [] Burning with urination   [] Blood in urine Skin:  [] Rashes   [] Ulcers   [] Wounds Psychological:  [] History of anxiety   []  History of major depression.  Physical Examination  Filed Vitals:   11/09/14 0845 11/09/14 1100 11/09/14 1115 11/09/14 1130  BP: 126/66 126/72 121/72 120/77  Pulse: 79 76 87 78  Resp: 20 19 17 18   Height: 6' (1.829 m)     Weight: 310 lb (140.615 kg)     SpO2: 96% 100% 97% 100%   Body mass index is 42.03 kg/(m^2).  Head: [place]/AT, No temporalis wasting. Prominent temp pulse not noted. Ear/Nose/Throat: Hearing grossly intact, nares w/o erythema or drainage, oropharynx w/o Erythema/Exudate, Eyes: PERRLA, EOMI.  Neck: Supple, no nuchal rigidity.  No bruit or JVD. Enlarged nodes creating mass in the right neck Pulmonary:  Good air movement, clear to auscultation  bilaterally, no use of accessory muscles.  Cardiac: RRR, normal S1, S2, no Murmurs, rubs or gallops. Vascular:  Vessel Right Left  Radial Palpable Palpable                                   Gastrointestinal: soft, non-tender/non-distended. No guarding/reflex.  Musculoskeletal: M/S 5/5 throughout.  Extremities without ischemic changes.  No deformity or atrophy.  Neurologic: CN 2-12 intact. Pain and light touch intact in extremities.  Symmetrical.  Speech is fluent. Motor exam as listed above. Psychiatric: Judgment intact, Mood & affect appropriate for pt's clinical situation. Dermatologic: No rashes or ulcers noted.  No cellulitis or open wounds. Lymph : Marked cervical adenopathy particularly on the right      CBC Lab Results  Component Value Date   WBC 10.0 11/08/2014   HGB 10.3* 11/08/2014   HCT 32.7* 11/08/2014   MCV 72.3* 11/08/2014   PLT 302 11/08/2014    BMET    Component Value Date/Time   NA 139 10/11/2014 0931   K 4.3 10/11/2014 0931   CL 102 10/11/2014 0931   CO2 29 10/11/2014 0931   GLUCOSE 100* 10/11/2014 0931   BUN 11 10/11/2014 0931   CREATININE 0.89 10/11/2014 0931   CALCIUM 9.5 10/11/2014 0931   GFRNONAA >60 10/11/2014 0931   GFRAA >60 10/11/2014 0931   CrCl cannot be calculated (Patient has no serum creatinine result on file.).  COAG Lab Results  Component Value Date   INR 1.20 11/08/2014   INR 1.2 10/11/2014      Assessment/Plan Hodgkin's lymphoma. Plan to place a Port-A-Cath for initiation of chemotherapy as venous access. Risks and benefits were discussed and informed consent was obtained.   DEW,JASON, MD  11/13/2014 5:19 PM

## 2014-11-15 ENCOUNTER — Other Ambulatory Visit: Payer: Self-pay | Admitting: Oncology

## 2014-11-16 ENCOUNTER — Inpatient Hospital Stay: Payer: Commercial Indemnity

## 2014-11-16 ENCOUNTER — Inpatient Hospital Stay (HOSPITAL_BASED_OUTPATIENT_CLINIC_OR_DEPARTMENT_OTHER): Payer: Commercial Indemnity | Admitting: Oncology

## 2014-11-16 VITALS — BP 144/77 | HR 76 | Temp 96.0°F | Resp 18 | Wt 302.0 lb

## 2014-11-16 DIAGNOSIS — C8190 Hodgkin lymphoma, unspecified, unspecified site: Secondary | ICD-10-CM

## 2014-11-16 DIAGNOSIS — C8191 Hodgkin lymphoma, unspecified, lymph nodes of head, face, and neck: Secondary | ICD-10-CM | POA: Diagnosis not present

## 2014-11-16 DIAGNOSIS — Z79899 Other long term (current) drug therapy: Secondary | ICD-10-CM | POA: Diagnosis not present

## 2014-11-16 LAB — CBC WITH DIFFERENTIAL/PLATELET
BASOS PCT: 1 %
Basophils Absolute: 0.1 10*3/uL (ref 0–0.1)
Eosinophils Absolute: 0.1 10*3/uL (ref 0–0.7)
Eosinophils Relative: 1 %
HCT: 32.5 % — ABNORMAL LOW (ref 40.0–52.0)
Hemoglobin: 10.5 g/dL — ABNORMAL LOW (ref 13.0–18.0)
Lymphocytes Relative: 9 %
Lymphs Abs: 0.8 10*3/uL — ABNORMAL LOW (ref 1.0–3.6)
MCH: 22.9 pg — AB (ref 26.0–34.0)
MCHC: 32.3 g/dL (ref 32.0–36.0)
MCV: 70.9 fL — ABNORMAL LOW (ref 80.0–100.0)
Monocytes Absolute: 0.5 10*3/uL (ref 0.2–1.0)
Monocytes Relative: 5 %
NEUTROS PCT: 84 %
Neutro Abs: 8.1 10*3/uL — ABNORMAL HIGH (ref 1.4–6.5)
Platelets: 351 10*3/uL (ref 150–440)
RBC: 4.58 MIL/uL (ref 4.40–5.90)
RDW: 15.5 % — ABNORMAL HIGH (ref 11.5–14.5)
WBC: 9.6 10*3/uL (ref 3.8–10.6)

## 2014-11-16 LAB — COMPREHENSIVE METABOLIC PANEL
ALT: 18 U/L (ref 17–63)
AST: 14 U/L — ABNORMAL LOW (ref 15–41)
Albumin: 3.7 g/dL (ref 3.5–5.0)
Alkaline Phosphatase: 85 U/L (ref 38–126)
Anion gap: 7 (ref 5–15)
BUN: 11 mg/dL (ref 6–20)
CO2: 27 mmol/L (ref 22–32)
Calcium: 9.1 mg/dL (ref 8.9–10.3)
Chloride: 101 mmol/L (ref 101–111)
Creatinine, Ser: 0.85 mg/dL (ref 0.61–1.24)
GFR calc Af Amer: >60 mL/min (ref >60)
GFR calc non Af Amer: >60 mL/min (ref >60)
GLUCOSE: 99 mg/dL (ref 65–99)
POTASSIUM: 4.1 mmol/L (ref 3.5–5.1)
Sodium: 135 mmol/L (ref 135–145)
TOTAL PROTEIN: 9.2 g/dL — AB (ref 6.5–8.1)
Total Bilirubin: 0.3 mg/dL (ref 0.3–1.2)

## 2014-11-16 MED ORDER — SODIUM CHLORIDE 0.9 % IV SOLN
Freq: Once | INTRAVENOUS | Status: AC
  Administered 2014-11-16: 12:00:00 via INTRAVENOUS
  Filled 2014-11-16: qty 5

## 2014-11-16 MED ORDER — DEXAMETHASONE SODIUM PHOSPHATE 100 MG/10ML IJ SOLN: Freq: Once | INTRAMUSCULAR | Status: DC

## 2014-11-16 MED ORDER — SODIUM CHLORIDE 0.9 % IJ SOLN
10.0000 mL | INTRAMUSCULAR | Status: DC | PRN
  Administered 2014-11-16: 10 mL
  Filled 2014-11-16: qty 10

## 2014-11-16 MED ORDER — SODIUM CHLORIDE 0.9 % IV SOLN
Freq: Once | INTRAVENOUS | Status: AC
  Administered 2014-11-16: 12:00:00 via INTRAVENOUS
  Filled 2014-11-16: qty 250

## 2014-11-16 MED ORDER — DOXORUBICIN HCL CHEMO IV INJECTION 2 MG/ML
25.0000 mg/m2 | Freq: Once | INTRAVENOUS | Status: AC
  Administered 2014-11-16: 66 mg via INTRAVENOUS
  Filled 2014-11-16: qty 33

## 2014-11-16 MED ORDER — PROCHLORPERAZINE MALEATE 10 MG PO TABS: 10.0000 mg | ORAL_TABLET | Freq: Four times a day (QID) | ORAL | Status: DC | PRN

## 2014-11-16 MED ORDER — PALONOSETRON HCL INJECTION 0.25 MG/5ML
0.2500 mg | Freq: Once | INTRAVENOUS | Status: AC
  Administered 2014-11-16: 0.25 mg via INTRAVENOUS
  Filled 2014-11-16: qty 5

## 2014-11-16 MED ORDER — SODIUM CHLORIDE 0.9 % IV SOLN
10.0000 [IU]/m2 | Freq: Once | INTRAVENOUS | Status: AC
  Administered 2014-11-16: 27 [IU] via INTRAVENOUS
  Filled 2014-11-16: qty 9

## 2014-11-16 MED ORDER — HEPARIN SOD (PORK) LOCK FLUSH 100 UNIT/ML IV SOLN
500.0000 [IU] | Freq: Once | INTRAVENOUS | Status: AC | PRN
  Administered 2014-11-16: 500 [IU]
  Filled 2014-11-16: qty 5

## 2014-11-16 MED ORDER — SODIUM CHLORIDE 0.9 % IV SOLN
375.0000 mg/m2 | Freq: Once | INTRAVENOUS | Status: AC
  Administered 2014-11-16: 1000 mg via INTRAVENOUS
  Filled 2014-11-16: qty 50

## 2014-11-16 MED ORDER — VINBLASTINE SULFATE CHEMO INJECTION 1 MG/ML
6.0000 mg/m2 | Freq: Once | INTRAVENOUS | Status: AC
  Administered 2014-11-16: 16 mg via INTRAVENOUS
  Filled 2014-11-16: qty 16

## 2014-11-16 NOTE — Progress Notes (Signed)
Port-a-cath is yielding a sluggish blood return. Unable to draw labs. Patient sent to lab to have labs drawn from a peripheral vein. Port-a-cath flushes without difficulty. No swelling, redness, or pain noted at port-a-cath site. MD, Dr. Grayland Ormond, notified via telephone. 1127- Per MD, Dr. Grayland Ormond, order: May use port-a-cath for chemotherapy treatment today.

## 2014-11-23 ENCOUNTER — Inpatient Hospital Stay: Payer: Managed Care, Other (non HMO) | Attending: Oncology

## 2014-11-23 ENCOUNTER — Inpatient Hospital Stay (HOSPITAL_BASED_OUTPATIENT_CLINIC_OR_DEPARTMENT_OTHER): Payer: Managed Care, Other (non HMO) | Admitting: Oncology

## 2014-11-23 VITALS — BP 110/73 | HR 52 | Temp 97.0°F | Resp 18 | Wt 306.0 lb

## 2014-11-23 DIAGNOSIS — Z79899 Other long term (current) drug therapy: Secondary | ICD-10-CM | POA: Insufficient documentation

## 2014-11-23 DIAGNOSIS — Z809 Family history of malignant neoplasm, unspecified: Secondary | ICD-10-CM

## 2014-11-23 DIAGNOSIS — D701 Agranulocytosis secondary to cancer chemotherapy: Secondary | ICD-10-CM | POA: Diagnosis not present

## 2014-11-23 DIAGNOSIS — C8190 Hodgkin lymphoma, unspecified, unspecified site: Secondary | ICD-10-CM

## 2014-11-23 DIAGNOSIS — Z5111 Encounter for antineoplastic chemotherapy: Secondary | ICD-10-CM | POA: Insufficient documentation

## 2014-11-23 DIAGNOSIS — C8191 Hodgkin lymphoma, unspecified, lymph nodes of head, face, and neck: Secondary | ICD-10-CM | POA: Insufficient documentation

## 2014-11-23 DIAGNOSIS — D649 Anemia, unspecified: Secondary | ICD-10-CM | POA: Insufficient documentation

## 2014-11-23 LAB — COMPREHENSIVE METABOLIC PANEL
ALK PHOS: 71 U/L (ref 38–126)
ALT: 17 U/L (ref 17–63)
AST: 13 U/L — ABNORMAL LOW (ref 15–41)
Albumin: 3.7 g/dL (ref 3.5–5.0)
Anion gap: 6 (ref 5–15)
BUN: 15 mg/dL (ref 6–20)
CHLORIDE: 101 mmol/L (ref 101–111)
CO2: 28 mmol/L (ref 22–32)
Calcium: 8.9 mg/dL (ref 8.9–10.3)
Creatinine, Ser: 0.82 mg/dL (ref 0.61–1.24)
GFR calc Af Amer: >60 mL/min (ref >60)
Glucose, Bld: 95 mg/dL (ref 65–99)
Potassium: 3.9 mmol/L (ref 3.5–5.1)
Sodium: 135 mmol/L (ref 135–145)
TOTAL PROTEIN: 7.9 g/dL (ref 6.5–8.1)
Total Bilirubin: 0.4 mg/dL (ref 0.3–1.2)

## 2014-11-23 LAB — CBC WITH DIFFERENTIAL/PLATELET
Basophils Absolute: 0 10*3/uL (ref 0–0.1)
Basophils Relative: 1 %
EOS PCT: 3 %
Eosinophils Absolute: 0.1 10*3/uL (ref 0–0.7)
HCT: 31.9 % — ABNORMAL LOW (ref 40.0–52.0)
Hemoglobin: 10.2 g/dL — ABNORMAL LOW (ref 13.0–18.0)
Lymphocytes Relative: 30 %
Lymphs Abs: 1 10*3/uL (ref 1.0–3.6)
MCH: 22.5 pg — ABNORMAL LOW (ref 26.0–34.0)
MCHC: 31.9 g/dL — ABNORMAL LOW (ref 32.0–36.0)
MCV: 70.3 fL — ABNORMAL LOW (ref 80.0–100.0)
Monocytes Absolute: 0.1 10*3/uL — ABNORMAL LOW (ref 0.2–1.0)
Monocytes Relative: 2 %
NEUTROS ABS: 2 10*3/uL (ref 1.4–6.5)
NEUTROS PCT: 64 %
PLATELETS: 286 10*3/uL (ref 150–440)
RBC: 4.53 MIL/uL (ref 4.40–5.90)
RDW: 15.2 % — AB (ref 11.5–14.5)
WBC: 3.1 10*3/uL — AB (ref 3.8–10.6)

## 2014-11-23 NOTE — Progress Notes (Signed)
Patient here today for follow up after completing first chemotherapy treatment with ABVD last week. Patient reports mild nausea day after chemo but denies any other side effects or concerns today.

## 2014-11-27 ENCOUNTER — Other Ambulatory Visit: Payer: Managed Care, Other (non HMO)

## 2014-11-27 ENCOUNTER — Ambulatory Visit: Payer: Managed Care, Other (non HMO) | Admitting: Oncology

## 2014-11-30 ENCOUNTER — Ambulatory Visit: Payer: Managed Care, Other (non HMO)

## 2014-11-30 ENCOUNTER — Inpatient Hospital Stay: Payer: Managed Care, Other (non HMO)

## 2014-11-30 ENCOUNTER — Inpatient Hospital Stay (HOSPITAL_BASED_OUTPATIENT_CLINIC_OR_DEPARTMENT_OTHER): Payer: Managed Care, Other (non HMO) | Admitting: Oncology

## 2014-11-30 ENCOUNTER — Other Ambulatory Visit: Payer: Managed Care, Other (non HMO)

## 2014-11-30 ENCOUNTER — Encounter (INDEPENDENT_AMBULATORY_CARE_PROVIDER_SITE_OTHER): Payer: Self-pay

## 2014-11-30 ENCOUNTER — Ambulatory Visit: Payer: Managed Care, Other (non HMO) | Admitting: Oncology

## 2014-11-30 ENCOUNTER — Telehealth: Payer: Self-pay

## 2014-11-30 VITALS — BP 124/73 | HR 53 | Temp 96.5°F | Resp 20 | Wt 310.4 lb

## 2014-11-30 VITALS — BP 101/62 | HR 60 | Temp 96.3°F | Resp 18

## 2014-11-30 DIAGNOSIS — Z809 Family history of malignant neoplasm, unspecified: Secondary | ICD-10-CM

## 2014-11-30 DIAGNOSIS — C8191 Hodgkin lymphoma, unspecified, lymph nodes of head, face, and neck: Secondary | ICD-10-CM | POA: Diagnosis not present

## 2014-11-30 DIAGNOSIS — D701 Agranulocytosis secondary to cancer chemotherapy: Secondary | ICD-10-CM | POA: Diagnosis not present

## 2014-11-30 DIAGNOSIS — D649 Anemia, unspecified: Secondary | ICD-10-CM | POA: Diagnosis not present

## 2014-11-30 DIAGNOSIS — Z79899 Other long term (current) drug therapy: Secondary | ICD-10-CM | POA: Diagnosis not present

## 2014-11-30 DIAGNOSIS — C8190 Hodgkin lymphoma, unspecified, unspecified site: Secondary | ICD-10-CM

## 2014-11-30 LAB — COMPREHENSIVE METABOLIC PANEL
ALT: 69 U/L — ABNORMAL HIGH (ref 17–63)
AST: 43 U/L — AB (ref 15–41)
Albumin: 3.7 g/dL (ref 3.5–5.0)
Alkaline Phosphatase: 77 U/L (ref 38–126)
Anion gap: 3 — ABNORMAL LOW (ref 5–15)
BILIRUBIN TOTAL: 0.3 mg/dL (ref 0.3–1.2)
BUN: 12 mg/dL (ref 6–20)
CALCIUM: 8.7 mg/dL — AB (ref 8.9–10.3)
CO2: 27 mmol/L (ref 22–32)
CREATININE: 1.02 mg/dL (ref 0.61–1.24)
Chloride: 106 mmol/L (ref 101–111)
GFR calc non Af Amer: >60 mL/min (ref >60)
GLUCOSE: 103 mg/dL — AB (ref 65–99)
Potassium: 3.7 mmol/L (ref 3.5–5.1)
SODIUM: 136 mmol/L (ref 135–145)
Total Protein: 7.7 g/dL (ref 6.5–8.1)

## 2014-11-30 LAB — CBC WITH DIFFERENTIAL/PLATELET
BASOS PCT: 1 %
Basophils Absolute: 0 10*3/uL (ref 0–0.1)
EOS PCT: 4 %
Eosinophils Absolute: 0.1 10*3/uL (ref 0–0.7)
HEMATOCRIT: 35 % — AB (ref 40.0–52.0)
Hemoglobin: 11 g/dL — ABNORMAL LOW (ref 13.0–18.0)
LYMPHS PCT: 38 %
Lymphs Abs: 0.9 10*3/uL — ABNORMAL LOW (ref 1.0–3.6)
MCH: 22.6 pg — AB (ref 26.0–34.0)
MCHC: 31.4 g/dL — AB (ref 32.0–36.0)
MCV: 72 fL — ABNORMAL LOW (ref 80.0–100.0)
MONOS PCT: 11 %
Monocytes Absolute: 0.2 10*3/uL (ref 0.2–1.0)
Neutro Abs: 1 10*3/uL — ABNORMAL LOW (ref 1.4–6.5)
Neutrophils Relative %: 46 %
Platelets: 228 10*3/uL (ref 150–440)
RBC: 4.86 MIL/uL (ref 4.40–5.90)
RDW: 16.1 % — ABNORMAL HIGH (ref 11.5–14.5)
WBC: 2.2 10*3/uL — AB (ref 3.8–10.6)

## 2014-11-30 MED ORDER — VINBLASTINE SULFATE CHEMO INJECTION 1 MG/ML
6.0000 mg/m2 | Freq: Once | INTRAVENOUS | Status: AC
  Administered 2014-11-30: 16 mg via INTRAVENOUS
  Filled 2014-11-30: qty 16

## 2014-11-30 MED ORDER — HEPARIN SOD (PORK) LOCK FLUSH 100 UNIT/ML IV SOLN
500.0000 [IU] | Freq: Once | INTRAVENOUS | Status: AC
  Administered 2014-11-30: 500 [IU] via INTRAVENOUS

## 2014-11-30 MED ORDER — SODIUM CHLORIDE 0.9 % IV SOLN: Freq: Once | INTRAVENOUS | Status: DC

## 2014-11-30 MED ORDER — SODIUM CHLORIDE 0.9 % IV SOLN
Freq: Once | INTRAVENOUS | Status: AC
  Administered 2014-11-30: 11:00:00 via INTRAVENOUS
  Filled 2014-11-30: qty 5

## 2014-11-30 MED ORDER — HEPARIN SOD (PORK) LOCK FLUSH 100 UNIT/ML IV SOLN
INTRAVENOUS | Status: AC
  Filled 2014-11-30: qty 5

## 2014-11-30 MED ORDER — SODIUM CHLORIDE 0.9 % IV SOLN
Freq: Once | INTRAVENOUS | Status: AC
  Administered 2014-11-30: 11:00:00 via INTRAVENOUS
  Filled 2014-11-30: qty 1000

## 2014-11-30 MED ORDER — PALONOSETRON HCL INJECTION 0.25 MG/5ML
0.2500 mg | Freq: Once | INTRAVENOUS | Status: AC
  Administered 2014-11-30: 0.25 mg via INTRAVENOUS
  Filled 2014-11-30: qty 5

## 2014-11-30 MED ORDER — SODIUM CHLORIDE 0.9 % IV SOLN
375.0000 mg/m2 | Freq: Once | INTRAVENOUS | Status: AC
  Administered 2014-11-30: 1000 mg via INTRAVENOUS
  Filled 2014-11-30: qty 50

## 2014-11-30 MED ORDER — DOXORUBICIN HCL CHEMO IV INJECTION 2 MG/ML
25.0000 mg/m2 | Freq: Once | INTRAVENOUS | Status: AC
  Administered 2014-11-30: 66 mg via INTRAVENOUS
  Filled 2014-11-30: qty 33

## 2014-11-30 MED ORDER — BLEOMYCIN SULFATE CHEMO INJECTION 30 UNIT
10.0000 [IU]/m2 | Freq: Once | INTRAMUSCULAR | Status: AC
  Administered 2014-11-30: 27 [IU] via INTRAVENOUS
  Filled 2014-11-30: qty 9

## 2014-11-30 NOTE — Progress Notes (Signed)
Administered Adriamycin to the patient as ordered - noted no blood return when drawing patient's lab today and confirmed with pharmacy and had second RN, Sinclair Grooms, observe patient's port prior to and during administration.  Patient reported no symptoms during administration and port was intact with no swelling, burning, or other signs of infiltration.  LJ

## 2014-11-30 NOTE — Progress Notes (Signed)
[name]  Telephone:([phone] Fax:([phone]  ID: Nicholas Ballard. OB: 09/08/1990  MR#: [id number]  SWH#:[id number]  Patient Care Team: Nicholas Sons, MD as PCP - General (Family Medicine)  CHIEF COMPLAINT:  Chief Complaint  Patient presents with  . Follow-up    post first chemotherapy treatment for Hodgkin's lymphoma    INTERVAL HISTORY: Patient returns to clinic today for further evaluation and to assess his toleration of cycle 1 of 12 of ABVD. He tolerated it well without significant side effects.  He has no neurologic complaints. He denies any fevers, chills, night sweats, or weight loss. He denies any difficulty swallowing or dysphagia. He has no chest pain, shortness of breath, or cough. He denies any nausea, vomiting, constipation, or diarrhea. He has no urinary complaints. Patient offers no further specific complaints.   REVIEW OF SYSTEMS:   Review of Systems  Constitutional: Negative.   HENT:       Denies dysphagia.  Respiratory: Negative.     As per HPI. Otherwise, a complete review of systems is negatve.  PAST MEDICAL HISTORY: Past Medical History  Diagnosis Date  . Neck mass     right   . Cancer     Hodgkins Lymphoma    PAST SURGICAL HISTORY: Past Surgical History  Procedure Laterality Date  . Peripheral vascular catheterization N/A 11/09/2014    Procedure: Glori Luis Cath Insertion;  Surgeon: Algernon Huxley, MD;  Location: [facility];  Service: Cardiovascular;  Laterality: N/A;    FAMILY HISTORY Family History  Problem Relation Age of Onset  . Cancer Paternal Grandfather     head and neck cancer  . Diabetes Other   . Hypertension Other   . Cancer Other     history of prostate and kidney cancer       ADVANCED DIRECTIVES:    HEALTH MAINTENANCE: History  Substance Use Topics  . Smoking status: Never Smoker   . Smokeless tobacco: Never Used  . Alcohol Use: Yes     Comment: occasional      Colonoscopy:  PAP:  Bone density:  Lipid panel:  No Known Allergies  Current Outpatient Prescriptions  Medication Sig Dispense Refill  . acetaminophen (TYLENOL) 500 MG tablet Take 500 mg by mouth every 6 (six) hours as needed.    Marland Kitchen HYDROcodone-acetaminophen (NORCO/VICODIN) 5-325 MG per tablet Take 1 tablet by mouth every 6 (six) hours as needed for moderate pain. 30 tablet 0  . lidocaine-prilocaine (EMLA) cream Apply to port area at least 1 hour prior to being accessed 30 g 0  . prochlorperazine (COMPAZINE) 10 MG tablet Take 1 tablet (10 mg total) by mouth every 6 (six) hours as needed for nausea or vomiting. 30 tablet 2   No current facility-administered medications for this visit.    OBJECTIVE: Filed Vitals:   11/23/14 1103  BP: 110/73  Pulse: 52  Temp: 97 F (36.1 C)  Resp: 18     Body mass index is 41.49 kg/(m^2).    ECOG FS:0 - Asymptomatic  General: Well-developed, well-nourished, no acute distress. Eyes: anicteric sclera. HEENT: Easily palpable right neck/supraclavicular mass. Lungs: Clear to auscultation bilaterally. Heart: Regular rate and rhythm. No rubs, murmurs, or gallops. Abdomen: Soft, nontender, nondistended. No organomegaly noted, normoactive bowel sounds. Musculoskeletal: No edema, cyanosis, or clubbing. Neuro: Alert, answering all questions appropriately. Cranial nerves grossly intact. Skin: No rashes or petechiae noted. Psych: Normal affect.    LAB RESULTS:  Lab Results  Component Value  Date   NA 135 11/23/2014   K 3.9 11/23/2014   CL 101 11/23/2014   CO2 28 11/23/2014   GLUCOSE 95 11/23/2014   BUN 15 11/23/2014   CREATININE 0.82 11/23/2014   CALCIUM 8.9 11/23/2014   PROT 7.9 11/23/2014   ALBUMIN 3.7 11/23/2014   AST 13* 11/23/2014   ALT 17 11/23/2014   ALKPHOS 71 11/23/2014   BILITOT 0.4 11/23/2014   GFRNONAA >60 11/23/2014   GFRAA >60 11/23/2014    Lab Results  Component Value Date   WBC 3.1* 11/23/2014   NEUTROABS 2.0  11/23/2014   HGB 10.2* 11/23/2014   HCT 31.9* 11/23/2014   MCV 70.3* 11/23/2014   PLT 286 11/23/2014     STUDIES: Nm Cardiac Muga Rest  11/03/2014   CLINICAL DATA:  Hodgkin's lymphoma, cardiotoxic chemotherapy  EXAM: NUCLEAR MEDICINE CARDIAC BLOOD POOL IMAGING (MUGA)  TECHNIQUE: Cardiac multi-gated acquisition was performed at rest following intravenous injection of Tc-44mlabeled red blood cells.  RADIOPHARMACEUTICALS:  20.32 mCi Technetium-922mn-vitro labeled autologous red blood cells IV  COMPARISON:  None  FINDINGS: LEFT ventricular ejection fraction is calculated at 64%, normal.  Exam was performed at a heart rate of 64 beats per minute.  Cine analysis of the gated blood pool in 3 projections demonstrates normal LEFT ventricular wall motion.  IMPRESSION: Normal LEFT ventricular ejection fraction of 64%.  Normal LV wall motion.   Electronically Signed   By: MaLavonia Dana.D.   On: 11/03/2014 10:46   Ct Biopsy  11/08/2014   CLINICAL DATA:  [age]ear old with Hodgkin's lymphoma.  EXAM: CT GUIDED BONE MARROW ASPIRATES AND BIOPSY  Physician: AdStephan MinisterHeAnselm PancoastMD  MEDICATIONS: 2 mg versed, 75 mcg fentanyl. A radiology nurse monitored the patient for moderate sedation.  ANESTHESIA/SEDATION: Sedation time: 30 minutes  PROCEDURE: The procedure was explained to the patient. The risks and benefits of the procedure were discussed and the patient's questions were addressed. Informed consent was obtained from the patient. The patient was placed prone on CT scan. Images of the pelvis were obtained. The right side of back was prepped and draped in sterile fashion. The skin and right posterior right ilium were anesthetized with 1% lidocaine. 11 gauge bone needle was directed into the right ilium with CT guidance. Two aspirates and 1 core biopsy obtained. Bandage placed over the puncture site.  FINDINGS: Needle directed into the posterior right ilium. Adequate specimens were obtained.  Estimated blood loss: Minimal   COMPLICATIONS: None  IMPRESSION: CT guided bone marrow aspirates and core biopsy.   Electronically Signed   By: AdMarkus Daft.D.   On: 11/08/2014 12:39    ASSESSMENT: Stage II Hodgkin's lymphoma.  PLAN:    1. Hodgkin's lymphoma: PET scan results reviewed independently. Patient does not have any disease below his diaphragm. Bone marrow biopsy reported as negative for disease. MUGA scan and PFTs adequate to proceed with treatment. Patient received cycle 1 of 12 of ABVD last week and tolerated it well. He does not require Neulasta at this time, but may consider with future cycles if necessary. Return to clinic in 1 week for consideration of cycle 2.   2. Leukopenia: Secondary to chemotherapy, consider Neulasta as above. 3. Anemia: Mild, monitor.  Patient expressed understanding and was in agreement with this plan. He also understands that He can call clinic at any time with any questions, concerns, or complaints.   No matching staging information was found for the patient.  TiLloyd HugerMD  11/30/2014 9:23 AM

## 2014-11-30 NOTE — Telephone Encounter (Signed)
Neutrophil =1.0.  MD okay to go ahead with ABVD treatment plan today.  Adding neulasta to patients treatment plans going forward

## 2014-11-30 NOTE — Progress Notes (Signed)
[place]  Telephone:([phone] [phone]  ID: Collier Flowers [name]. OB: [birth year]  MR#: [id number]  GYI#:[id number]  Patient Care Team: [name], MD as PCP - General (Family Medicine)  CHIEF COMPLAINT:  Chief Complaint  Patient presents with  . Follow-up    Hodgkin's lymphoma  . Chemotherapy    first treatment    INTERVAL HISTORY: Patient returns to clinic today for further evaluation and initiation of cycle 1 of 12 of ABVD. He has no neurologic complaints. He denies any fevers, chills, night sweats, or weight loss. He denies any difficulty swallowing or dysphagia. He has no chest pain, shortness of breath, or cough. He denies any nausea, vomiting, constipation, or diarrhea. He has no urinary complaints. Patient offers no further specific complaints.   REVIEW OF SYSTEMS:   Review of Systems  Constitutional: Negative.   HENT:       Denies dysphagia.  Respiratory: Negative.     As per HPI. Otherwise, a complete review of systems is negatve.  PAST MEDICAL HISTORY: Past Medical History  Diagnosis Date  . Neck mass     right   . Cancer     Hodgkins Lymphoma    PAST SURGICAL HISTORY: Past Surgical History  Procedure Laterality Date  . Peripheral vascular catheterization N/A 11/09/2014    Procedure: Glori Luis Cath Insertion;  Surgeon: Algernon Huxley, MD;  Location: [facility];  Service: Cardiovascular;  Laterality: N/A;    FAMILY HISTORY Family History  Problem Relation Age of Onset  . Cancer Paternal Grandfather     head and neck cancer  . Diabetes Other   . Hypertension Other   . Cancer Other     history of prostate and kidney cancer       ADVANCED DIRECTIVES:    HEALTH MAINTENANCE: History  Substance Use Topics  . Smoking status: Never Smoker   . Smokeless tobacco: Never Used  . Alcohol Use: Yes     Comment: occasional     Colonoscopy:  PAP:  Bone density:  Lipid panel:  No Known Allergies  Current  Outpatient Prescriptions  Medication Sig Dispense Refill  . acetaminophen (TYLENOL) 500 MG tablet Take 500 mg by mouth every 6 (six) hours as needed.    Marland Kitchen HYDROcodone-acetaminophen (NORCO/VICODIN) 5-325 MG per tablet Take 1 tablet by mouth every 6 (six) hours as needed for moderate pain. 30 tablet 0  . lidocaine-prilocaine (EMLA) cream Apply to port area at least 1 hour prior to being accessed 30 g 0  . prochlorperazine (COMPAZINE) 10 MG tablet Take 1 tablet (10 mg total) by mouth every 6 (six) hours as needed for nausea or vomiting. 30 tablet 2   No current facility-administered medications for this visit.    OBJECTIVE: Filed Vitals:   11/16/14 1108  BP: 144/77  Pulse: 76  Temp: 96 F (35.6 C)  Resp: 18     Body mass index is 40.95 kg/(m^2).    ECOG FS:0 - Asymptomatic  General: Well-developed, well-nourished, no acute distress. Eyes: anicteric sclera. HEENT: Easily palpable right neck/supraclavicular mass. Lungs: Clear to auscultation bilaterally. Heart: Regular rate and rhythm. No rubs, murmurs, or gallops. Abdomen: Soft, nontender, nondistended. No organomegaly noted, normoactive bowel sounds. Musculoskeletal: No edema, cyanosis, or clubbing. Neuro: Alert, answering all questions appropriately. Cranial nerves grossly intact. Skin: No rashes or petechiae noted. Psych: Normal affect.    LAB RESULTS:  Lab Results  Component Value Date   NA 135 11/23/2014   K  3.9 11/23/2014   CL 101 11/23/2014   CO2 28 11/23/2014   GLUCOSE 95 11/23/2014   BUN 15 11/23/2014   CREATININE 0.82 11/23/2014   CALCIUM 8.9 11/23/2014   PROT 7.9 11/23/2014   ALBUMIN 3.7 11/23/2014   AST 13* 11/23/2014   ALT 17 11/23/2014   ALKPHOS 71 11/23/2014   BILITOT 0.4 11/23/2014   GFRNONAA >60 11/23/2014   GFRAA >60 11/23/2014    Lab Results  Component Value Date   WBC 3.1* 11/23/2014   NEUTROABS 2.0 11/23/2014   HGB 10.2* 11/23/2014   HCT 31.9* 11/23/2014   MCV 70.3* 11/23/2014   PLT 286  11/23/2014     STUDIES: Nm Cardiac Muga Rest  11/03/2014   CLINICAL DATA:  Hodgkin's lymphoma, cardiotoxic chemotherapy  EXAM: NUCLEAR MEDICINE CARDIAC BLOOD POOL IMAGING (MUGA)  TECHNIQUE: Cardiac multi-gated acquisition was performed at rest following intravenous injection of Tc-51mlabeled red blood cells.  RADIOPHARMACEUTICALS:  20.32 mCi Technetium-948mn-vitro labeled autologous red blood cells IV  COMPARISON:  None  FINDINGS: LEFT ventricular ejection fraction is calculated at 64%, normal.  Exam was performed at a heart rate of 64 beats per minute.  Cine analysis of the gated blood pool in 3 projections demonstrates normal LEFT ventricular wall motion.  IMPRESSION: Normal LEFT ventricular ejection fraction of 64%.  Normal LV wall motion.   Electronically Signed   By: MaLavonia Dana.D.   On: 11/03/2014 10:46   Ct Biopsy  11/08/2014   CLINICAL DATA:  [age]ear old with Hodgkin's lymphoma.  EXAM: CT GUIDED BONE MARROW ASPIRATES AND BIOPSY  Physician: AdStephan MinisterHeAnselm PancoastMD  MEDICATIONS: 2 mg versed, 75 mcg fentanyl. A radiology nurse monitored the patient for moderate sedation.  ANESTHESIA/SEDATION: Sedation time: 30 minutes  PROCEDURE: The procedure was explained to the patient. The risks and benefits of the procedure were discussed and the patient's questions were addressed. Informed consent was obtained from the patient. The patient was placed prone on CT scan. Images of the pelvis were obtained. The right side of back was prepped and draped in sterile fashion. The skin and right posterior right ilium were anesthetized with 1% lidocaine. 11 gauge bone needle was directed into the right ilium with CT guidance. Two aspirates and 1 core biopsy obtained. Bandage placed over the puncture site.  FINDINGS: Needle directed into the posterior right ilium. Adequate specimens were obtained.  Estimated blood loss: Minimal  COMPLICATIONS: None  IMPRESSION: CT guided bone marrow aspirates and core biopsy.   Electronically  Signed   By: AdMarkus Daft.D.   On: 11/08/2014 12:39    ASSESSMENT: Stage II Hodgkin's lymphoma.  PLAN:    1. Hodgkin's lymphoma: PET scan results reviewed independently. Patient does not have any disease below his diaphragm. Bone marrow biopsy reported as negative for disease. MUGA scan and PFTs adequate to proceed with treatment. Proceed with cycle 1 of 12 of ABVD today. Return to clinic in 1 week for laboratory work and evaluation and then in 2 weeks for consideration of cycle 2.   Patient expressed understanding and was in agreement with this plan. He also understands that He can call clinic at any time with any questions, concerns, or complaints.   No matching staging information was found for the patient.  TiLloyd HugerMD   11/30/2014 9:20 AM

## 2014-12-02 ENCOUNTER — Ambulatory Visit: Payer: Managed Care, Other (non HMO)

## 2014-12-02 VITALS — BP 144/66 | HR 56 | Temp 97.2°F | Resp 18

## 2014-12-02 DIAGNOSIS — C8191 Hodgkin lymphoma, unspecified, lymph nodes of head, face, and neck: Secondary | ICD-10-CM | POA: Diagnosis not present

## 2014-12-04 MED ORDER — PEGFILGRASTIM INJECTION 6 MG/0.6ML ~~LOC~~: 6.0000 mg | PREFILLED_SYRINGE | Freq: Once | SUBCUTANEOUS | Status: DC

## 2014-12-04 NOTE — Progress Notes (Signed)
[name]  [phone] [phone]  ID: Nicholas Ballard. OB: 1991-02-05  MR#: [id number]  BWI#:[id number]  Patient Care Team: [name], MD as PCP - General (Family Medicine)  CHIEF COMPLAINT:  Chief Complaint  Patient presents with  . Follow-up    Hodgkins Lymphoma  . Chemotherapy    ABVD    INTERVAL HISTORY: Patient returns to clinic today for further evaluation and consideration of cycle 2 of 12 of ABVD. He currently feels well and is asymptomatic. He has no neurologic complaints. He denies any fevers, chills, night sweats, or weight loss. He denies any difficulty swallowing or dysphagia. He has no chest pain, shortness of breath, or cough. He denies any nausea, vomiting, constipation, or diarrhea. He has no urinary complaints. Patient offers no specific complaints today.   REVIEW OF SYSTEMS:   Review of Systems  Constitutional: Negative.   HENT:       Denies dysphagia.  Respiratory: Negative.     As per HPI. Otherwise, a complete review of systems is negatve.  PAST MEDICAL HISTORY: Past Medical History  Diagnosis Date  . Neck mass     right   . Cancer     Hodgkins Lymphoma    PAST SURGICAL HISTORY: Past Surgical History  Procedure Laterality Date  . Peripheral vascular catheterization N/A 11/09/2014    Procedure: Glori Luis Cath Insertion;  Surgeon: Algernon Huxley, MD;  Location: [facility];  Service: Cardiovascular;  Laterality: N/A;    FAMILY HISTORY Family History  Problem Relation Age of Onset  . Cancer Paternal Grandfather     head and neck cancer  . Diabetes Other   . Hypertension Other   . Cancer Other     history of prostate and kidney cancer       ADVANCED DIRECTIVES:    HEALTH MAINTENANCE: History  Substance Use Topics  . Smoking status: Never Smoker   . Smokeless tobacco: Never Used  . Alcohol Use: Yes     Comment: occasional     Colonoscopy:  PAP:  Bone density:  Lipid  panel:  No Known Allergies  Current Outpatient Prescriptions  Medication Sig Dispense Refill  . acetaminophen (TYLENOL) 500 MG tablet Take 500 mg by mouth every 6 (six) hours as needed.    Marland Kitchen HYDROcodone-acetaminophen (NORCO/VICODIN) 5-325 MG per tablet Take 1 tablet by mouth every 6 (six) hours as needed for moderate pain. 30 tablet 0  . lidocaine-prilocaine (EMLA) cream Apply to port area at least 1 hour prior to being accessed 30 g 0  . prochlorperazine (COMPAZINE) 10 MG tablet Take 1 tablet (10 mg total) by mouth every 6 (six) hours as needed for nausea or vomiting. 30 tablet 2   Current Facility-Administered Medications  Medication Dose Route Frequency Provider Last Rate Last Dose  . pegfilgrastim (NEULASTA) injection 6 mg  6 mg Subcutaneous Once Nicholas Huger, MD        OBJECTIVE: Filed Vitals:   11/30/14 0950  BP: 124/73  Pulse: 53  Temp: 96.5 F (35.8 C)  Resp: 20     Body mass index is 42.09 kg/(m^2).    ECOG FS:0 - Asymptomatic  General: Well-developed, well-nourished, no acute distress. Eyes: anicteric sclera. HEENT: Easily palpable right neck/supraclavicular mass. Lungs: Clear to auscultation bilaterally. Heart: Regular rate and rhythm. No rubs, murmurs, or gallops. Abdomen: Soft, nontender, nondistended. No organomegaly noted, normoactive bowel sounds. Musculoskeletal: No edema, cyanosis, or clubbing. Neuro: Alert, answering all questions appropriately. Cranial  nerves grossly intact. Skin: No rashes or petechiae noted. Psych: Normal affect.    LAB RESULTS:  Lab Results  Component Value Date   NA 136 11/30/2014   K 3.7 11/30/2014   CL 106 11/30/2014   CO2 27 11/30/2014   GLUCOSE 103* 11/30/2014   BUN 12 11/30/2014   CREATININE 1.02 11/30/2014   CALCIUM 8.7* 11/30/2014   PROT 7.7 11/30/2014   ALBUMIN 3.7 11/30/2014   AST 43* 11/30/2014   ALT 69* 11/30/2014   ALKPHOS 77 11/30/2014   BILITOT 0.3 11/30/2014   GFRNONAA >60 11/30/2014   GFRAA >60  11/30/2014    Lab Results  Component Value Date   WBC 2.2* 11/30/2014   NEUTROABS 1.0* 11/30/2014   HGB 11.0* 11/30/2014   HCT 35.0* 11/30/2014   MCV 72.0* 11/30/2014   PLT 228 11/30/2014     STUDIES: Ct Biopsy  11/08/2014   CLINICAL DATA:  24 year old with Hodgkin's lymphoma.  EXAM: CT GUIDED BONE MARROW ASPIRATES AND BIOPSY  Physician: Stephan Minister. Anselm Pancoast, MD  MEDICATIONS: 2 mg versed, 75 mcg fentanyl. A radiology nurse monitored the patient for moderate sedation.  ANESTHESIA/SEDATION: Sedation time: 30 minutes  PROCEDURE: The procedure was explained to the patient. The risks and benefits of the procedure were discussed and the patient's questions were addressed. Informed consent was obtained from the patient. The patient was placed prone on CT scan. Images of the pelvis were obtained. The right side of back was prepped and draped in sterile fashion. The skin and right posterior right ilium were anesthetized with 1% lidocaine. 11 gauge bone needle was directed into the right ilium with CT guidance. Two aspirates and 1 core biopsy obtained. Bandage placed over the puncture site.  FINDINGS: Needle directed into the posterior right ilium. Adequate specimens were obtained.  Estimated blood loss: Minimal  COMPLICATIONS: None  IMPRESSION: CT guided bone marrow aspirates and core biopsy.   Electronically Signed   By: Markus Daft M.D.   On: 11/08/2014 12:39    ASSESSMENT: Stage II Hodgkin's lymphoma.  PLAN:    1. Hodgkin's lymphoma: PET scan results reviewed independently. Patient does not have any disease below his diaphragm. Bone marrow biopsy reported as negative for disease. MUGA scan and PFTs adequate to proceed with treatment. Proceed with cycle 2 of 12 of ABVD today. Return to clinic in 2 days for Neulasta and then in 2 weeks for consideration of cycle 3.  2. Leukopenia: Secondary to chemotherapy, Neulasta as above. 3. Anemia: Mild, monitor.  Patient expressed understanding and was in agreement  with this plan. He also understands that He can call clinic at any time with any questions, concerns, or complaints.   No matching staging information was found for the patient.  Nicholas Huger, MD   12/04/2014 10:58 AM

## 2014-12-07 ENCOUNTER — Inpatient Hospital Stay: Payer: Managed Care, Other (non HMO)

## 2014-12-07 DIAGNOSIS — C8190 Hodgkin lymphoma, unspecified, unspecified site: Secondary | ICD-10-CM

## 2014-12-07 DIAGNOSIS — C8191 Hodgkin lymphoma, unspecified, lymph nodes of head, face, and neck: Secondary | ICD-10-CM | POA: Diagnosis not present

## 2014-12-07 LAB — CBC WITH DIFFERENTIAL/PLATELET
Basophils Absolute: 0.1 10*3/uL (ref 0–0.1)
Basophils Relative: 1 %
EOS ABS: 0.2 10*3/uL (ref 0–0.7)
Eosinophils Relative: 1 %
HCT: 36.2 % — ABNORMAL LOW (ref 40.0–52.0)
HEMOGLOBIN: 11.5 g/dL — AB (ref 13.0–18.0)
LYMPHS ABS: 1.3 10*3/uL (ref 1.0–3.6)
LYMPHS PCT: 10 %
MCH: 22.9 pg — AB (ref 26.0–34.0)
MCHC: 31.6 g/dL — ABNORMAL LOW (ref 32.0–36.0)
MCV: 72.4 fL — ABNORMAL LOW (ref 80.0–100.0)
MONOS PCT: 10 %
Monocytes Absolute: 1.2 10*3/uL — ABNORMAL HIGH (ref 0.2–1.0)
NEUTROS ABS: 10.1 10*3/uL — AB (ref 1.4–6.5)
NEUTROS PCT: 78 %
PLATELETS: 200 10*3/uL (ref 150–440)
RBC: 5 MIL/uL (ref 4.40–5.90)
RDW: 17.6 % — ABNORMAL HIGH (ref 11.5–14.5)
WBC: 12.8 10*3/uL — AB (ref 3.8–10.6)

## 2014-12-07 LAB — COMPREHENSIVE METABOLIC PANEL
ALT: 44 U/L (ref 17–63)
ANION GAP: 5 (ref 5–15)
AST: 19 U/L (ref 15–41)
Albumin: 4.1 g/dL (ref 3.5–5.0)
Alkaline Phosphatase: 96 U/L (ref 38–126)
BILIRUBIN TOTAL: 0.4 mg/dL (ref 0.3–1.2)
BUN: 12 mg/dL (ref 6–20)
CHLORIDE: 101 mmol/L (ref 101–111)
CO2: 28 mmol/L (ref 22–32)
CREATININE: 1 mg/dL (ref 0.61–1.24)
Calcium: 8.9 mg/dL (ref 8.9–10.3)
GFR calc Af Amer: >60 mL/min (ref >60)
GLUCOSE: 93 mg/dL (ref 65–99)
Potassium: 3.5 mmol/L (ref 3.5–5.1)
Sodium: 134 mmol/L — ABNORMAL LOW (ref 135–145)
Total Protein: 7.9 g/dL (ref 6.5–8.1)

## 2014-12-14 ENCOUNTER — Inpatient Hospital Stay: Payer: Managed Care, Other (non HMO)

## 2014-12-14 ENCOUNTER — Encounter: Payer: Self-pay | Admitting: Hematology and Oncology

## 2014-12-14 ENCOUNTER — Inpatient Hospital Stay (HOSPITAL_BASED_OUTPATIENT_CLINIC_OR_DEPARTMENT_OTHER): Payer: Managed Care, Other (non HMO) | Admitting: Hematology and Oncology

## 2014-12-14 VITALS — BP 110/70 | HR 64 | Temp 97.2°F | Resp 16 | Wt 312.2 lb

## 2014-12-14 DIAGNOSIS — D701 Agranulocytosis secondary to cancer chemotherapy: Secondary | ICD-10-CM | POA: Diagnosis not present

## 2014-12-14 DIAGNOSIS — C8191 Hodgkin lymphoma, unspecified, lymph nodes of head, face, and neck: Secondary | ICD-10-CM | POA: Diagnosis not present

## 2014-12-14 DIAGNOSIS — Z79899 Other long term (current) drug therapy: Secondary | ICD-10-CM

## 2014-12-14 DIAGNOSIS — C8190 Hodgkin lymphoma, unspecified, unspecified site: Secondary | ICD-10-CM

## 2014-12-14 DIAGNOSIS — D649 Anemia, unspecified: Secondary | ICD-10-CM | POA: Diagnosis not present

## 2014-12-14 DIAGNOSIS — D509 Iron deficiency anemia, unspecified: Secondary | ICD-10-CM

## 2014-12-14 DIAGNOSIS — C8111 Nodular sclerosis classical Hodgkin lymphoma, lymph nodes of head, face, and neck: Secondary | ICD-10-CM

## 2014-12-14 LAB — CBC WITH DIFFERENTIAL/PLATELET
Basophils Absolute: 0 10*3/uL (ref 0–0.1)
Basophils Relative: 0 %
Eosinophils Absolute: 0.1 10*3/uL (ref 0–0.7)
Eosinophils Relative: 1 %
HCT: 36.8 % — ABNORMAL LOW (ref 40.0–52.0)
HEMOGLOBIN: 11.7 g/dL — AB (ref 13.0–18.0)
LYMPHS ABS: 1.1 10*3/uL (ref 1.0–3.6)
Lymphocytes Relative: 19 %
MCH: 23.4 pg — ABNORMAL LOW (ref 26.0–34.0)
MCHC: 31.7 g/dL — ABNORMAL LOW (ref 32.0–36.0)
MCV: 73.9 fL — ABNORMAL LOW (ref 80.0–100.0)
MONOS PCT: 8 %
Monocytes Absolute: 0.5 10*3/uL (ref 0.2–1.0)
NEUTROS ABS: 4.2 10*3/uL (ref 1.4–6.5)
Neutrophils Relative %: 72 %
Platelets: 150 10*3/uL (ref 150–440)
RBC: 4.98 MIL/uL (ref 4.40–5.90)
RDW: 19 % — ABNORMAL HIGH (ref 11.5–14.5)
WBC: 5.9 10*3/uL (ref 3.8–10.6)

## 2014-12-14 LAB — COMPREHENSIVE METABOLIC PANEL
ALT: 86 U/L — ABNORMAL HIGH (ref 17–63)
ANION GAP: 7 (ref 5–15)
AST: 47 U/L — ABNORMAL HIGH (ref 15–41)
Albumin: 4.1 g/dL (ref 3.5–5.0)
Alkaline Phosphatase: 80 U/L (ref 38–126)
BILIRUBIN TOTAL: 0.3 mg/dL (ref 0.3–1.2)
BUN: 16 mg/dL (ref 6–20)
CHLORIDE: 105 mmol/L (ref 101–111)
CO2: 25 mmol/L (ref 22–32)
Calcium: 8.6 mg/dL — ABNORMAL LOW (ref 8.9–10.3)
Creatinine, Ser: 1.02 mg/dL (ref 0.61–1.24)
GFR calc Af Amer: >60 mL/min (ref >60)
Glucose, Bld: 98 mg/dL (ref 65–99)
Potassium: 3.8 mmol/L (ref 3.5–5.1)
Sodium: 137 mmol/L (ref 135–145)
Total Protein: 7.7 g/dL (ref 6.5–8.1)

## 2014-12-14 MED ORDER — SODIUM CHLORIDE 0.9 % IV SOLN
Freq: Once | INTRAVENOUS | Status: AC
  Administered 2014-12-14: 11:00:00 via INTRAVENOUS
  Filled 2014-12-14: qty 1000

## 2014-12-14 MED ORDER — BLEOMYCIN SULFATE CHEMO INJECTION 30 UNIT
10.0000 [IU]/m2 | Freq: Once | INTRAMUSCULAR | Status: AC
  Administered 2014-12-14: 27 [IU] via INTRAVENOUS
  Filled 2014-12-14: qty 9

## 2014-12-14 MED ORDER — HEPARIN SOD (PORK) LOCK FLUSH 100 UNIT/ML IV SOLN
500.0000 [IU] | Freq: Once | INTRAVENOUS | Status: AC | PRN
  Administered 2014-12-14: 500 [IU]
  Filled 2014-12-14: qty 5

## 2014-12-14 MED ORDER — DOXORUBICIN HCL CHEMO IV INJECTION 2 MG/ML
25.0000 mg/m2 | Freq: Once | INTRAVENOUS | Status: AC
  Administered 2014-12-14: 66 mg via INTRAVENOUS
  Filled 2014-12-14: qty 33

## 2014-12-14 MED ORDER — DACARBAZINE 200 MG IV SOLR
375.0000 mg/m2 | Freq: Once | INTRAVENOUS | Status: AC
  Administered 2014-12-14: 1000 mg via INTRAVENOUS
  Filled 2014-12-14: qty 50

## 2014-12-14 MED ORDER — SODIUM CHLORIDE 0.9 % IV SOLN
6.0000 mg/m2 | Freq: Once | INTRAVENOUS | Status: AC
  Administered 2014-12-14: 16 mg via INTRAVENOUS
  Filled 2014-12-14: qty 16

## 2014-12-14 MED ORDER — SODIUM CHLORIDE 0.9 % IV SOLN
Freq: Once | INTRAVENOUS | Status: AC
  Administered 2014-12-14: 11:00:00 via INTRAVENOUS
  Filled 2014-12-14: qty 8

## 2014-12-16 ENCOUNTER — Ambulatory Visit: Payer: Managed Care, Other (non HMO)

## 2014-12-16 VITALS — BP 118/76 | HR 72 | Temp 97.0°F | Resp 18

## 2014-12-16 DIAGNOSIS — C8191 Hodgkin lymphoma, unspecified, lymph nodes of head, face, and neck: Secondary | ICD-10-CM | POA: Diagnosis not present

## 2014-12-16 MED ORDER — PEGFILGRASTIM INJECTION 6 MG/0.6ML ~~LOC~~
6.0000 mg | PREFILLED_SYRINGE | Freq: Once | SUBCUTANEOUS | Status: AC
  Administered 2014-12-16: 6 mg via SUBCUTANEOUS

## 2014-12-28 ENCOUNTER — Ambulatory Visit: Payer: Managed Care, Other (non HMO)

## 2014-12-28 ENCOUNTER — Ambulatory Visit: Payer: Managed Care, Other (non HMO) | Admitting: Hematology and Oncology

## 2014-12-28 ENCOUNTER — Inpatient Hospital Stay (HOSPITAL_BASED_OUTPATIENT_CLINIC_OR_DEPARTMENT_OTHER): Payer: Managed Care, Other (non HMO) | Admitting: Oncology

## 2014-12-28 ENCOUNTER — Inpatient Hospital Stay: Payer: Managed Care, Other (non HMO) | Attending: Oncology

## 2014-12-28 ENCOUNTER — Encounter: Payer: Self-pay | Admitting: Oncology

## 2014-12-28 ENCOUNTER — Inpatient Hospital Stay: Payer: Managed Care, Other (non HMO)

## 2014-12-28 ENCOUNTER — Other Ambulatory Visit: Payer: Managed Care, Other (non HMO)

## 2014-12-28 VITALS — Temp 96.1°F

## 2014-12-28 VITALS — BP 109/67 | HR 74 | Temp 95.6°F | Resp 18 | Wt 316.1 lb

## 2014-12-28 DIAGNOSIS — C8191 Hodgkin lymphoma, unspecified, lymph nodes of head, face, and neck: Secondary | ICD-10-CM | POA: Diagnosis not present

## 2014-12-28 DIAGNOSIS — Z79899 Other long term (current) drug therapy: Secondary | ICD-10-CM | POA: Diagnosis not present

## 2014-12-28 DIAGNOSIS — C8190 Hodgkin lymphoma, unspecified, unspecified site: Secondary | ICD-10-CM

## 2014-12-28 DIAGNOSIS — Z5111 Encounter for antineoplastic chemotherapy: Secondary | ICD-10-CM | POA: Insufficient documentation

## 2014-12-28 DIAGNOSIS — D649 Anemia, unspecified: Secondary | ICD-10-CM | POA: Diagnosis not present

## 2014-12-28 LAB — CBC WITH DIFFERENTIAL/PLATELET
BASOS ABS: 0 10*3/uL (ref 0–0.1)
Basophils Relative: 0 %
Eosinophils Absolute: 0.1 10*3/uL (ref 0–0.7)
Eosinophils Relative: 1 %
HEMATOCRIT: 38.2 % — AB (ref 40.0–52.0)
Hemoglobin: 12.2 g/dL — ABNORMAL LOW (ref 13.0–18.0)
Lymphocytes Relative: 15 %
Lymphs Abs: 1.1 10*3/uL (ref 1.0–3.6)
MCH: 24.1 pg — ABNORMAL LOW (ref 26.0–34.0)
MCHC: 32 g/dL (ref 32.0–36.0)
MCV: 75.4 fL — ABNORMAL LOW (ref 80.0–100.0)
Monocytes Absolute: 0.5 10*3/uL (ref 0.2–1.0)
Monocytes Relative: 7 %
NEUTROS ABS: 5.6 10*3/uL (ref 1.4–6.5)
Neutrophils Relative %: 77 %
Platelets: 211 10*3/uL (ref 150–440)
RBC: 5.06 MIL/uL (ref 4.40–5.90)
RDW: 24.4 % — AB (ref 11.5–14.5)
WBC: 7.3 10*3/uL (ref 3.8–10.6)

## 2014-12-28 LAB — COMPREHENSIVE METABOLIC PANEL
ALK PHOS: 90 U/L (ref 38–126)
ALT: 63 U/L (ref 17–63)
AST: 32 U/L (ref 15–41)
Albumin: 4.2 g/dL (ref 3.5–5.0)
Anion gap: 5 (ref 5–15)
BUN: 11 mg/dL (ref 6–20)
CO2: 26 mmol/L (ref 22–32)
Calcium: 8.9 mg/dL (ref 8.9–10.3)
Chloride: 105 mmol/L (ref 101–111)
Creatinine, Ser: 1.13 mg/dL (ref 0.61–1.24)
GFR calc Af Amer: >60 mL/min (ref >60)
GFR calc non Af Amer: >60 mL/min (ref >60)
Glucose, Bld: 116 mg/dL — ABNORMAL HIGH (ref 65–99)
POTASSIUM: 3.5 mmol/L (ref 3.5–5.1)
SODIUM: 136 mmol/L (ref 135–145)
Total Bilirubin: 0.4 mg/dL (ref 0.3–1.2)
Total Protein: 7.6 g/dL (ref 6.5–8.1)

## 2014-12-28 MED ORDER — PEGFILGRASTIM INJECTION 6 MG/0.6ML ~~LOC~~: 6.0000 mg | PREFILLED_SYRINGE | Freq: Once | SUBCUTANEOUS | Status: DC

## 2014-12-28 MED ORDER — SODIUM CHLORIDE 0.9 % IV SOLN
10.0000 [IU]/m2 | Freq: Once | INTRAVENOUS | Status: AC
  Administered 2014-12-28: 27 [IU] via INTRAVENOUS
  Filled 2014-12-28: qty 9

## 2014-12-28 MED ORDER — PROMETHAZINE HCL 25 MG/ML IJ SOLN
25.0000 mg | Freq: Once | INTRAMUSCULAR | Status: AC
  Administered 2014-12-28: 25 mg via INTRAVENOUS
  Filled 2014-12-28: qty 1

## 2014-12-28 MED ORDER — SODIUM CHLORIDE 0.9 % IV SOLN
Freq: Once | INTRAVENOUS | Status: AC
  Administered 2014-12-28: 11:00:00 via INTRAVENOUS
  Filled 2014-12-28: qty 8

## 2014-12-28 MED ORDER — SODIUM CHLORIDE 0.9 % IJ SOLN
10.0000 mL | INTRAMUSCULAR | Status: DC | PRN
  Administered 2014-12-28: 10 mL
  Filled 2014-12-28: qty 10

## 2014-12-28 MED ORDER — DOXORUBICIN HCL CHEMO IV INJECTION 2 MG/ML
25.0000 mg/m2 | Freq: Once | INTRAVENOUS | Status: AC
  Administered 2014-12-28: 66 mg via INTRAVENOUS
  Filled 2014-12-28: qty 33

## 2014-12-28 MED ORDER — HEPARIN SOD (PORK) LOCK FLUSH 100 UNIT/ML IV SOLN
500.0000 [IU] | Freq: Once | INTRAVENOUS | Status: AC | PRN
  Administered 2014-12-28: 500 [IU]
  Filled 2014-12-28: qty 5

## 2014-12-28 MED ORDER — PEGFILGRASTIM 6 MG/0.6ML ~~LOC~~ PSKT
6.0000 mg | PREFILLED_SYRINGE | Freq: Once | SUBCUTANEOUS | Status: AC
  Administered 2014-12-28: 6 mg via SUBCUTANEOUS
  Filled 2014-12-28: qty 0.6

## 2014-12-28 MED ORDER — SODIUM CHLORIDE 0.9 % IV SOLN
6.0000 mg/m2 | Freq: Once | INTRAVENOUS | Status: AC
  Administered 2014-12-28: 16 mg via INTRAVENOUS
  Filled 2014-12-28: qty 16

## 2014-12-28 MED ORDER — DACARBAZINE 200 MG IV SOLR
375.0000 mg/m2 | Freq: Once | INTRAVENOUS | Status: AC
  Administered 2014-12-28: 1000 mg via INTRAVENOUS
  Filled 2014-12-28: qty 50

## 2014-12-28 MED ORDER — SODIUM CHLORIDE 0.9 % IV SOLN
Freq: Once | INTRAVENOUS | Status: AC
Start: 2014-12-28 — End: 2014-12-28
  Administered 2014-12-28: 10:00:00 via INTRAVENOUS
  Filled 2014-12-28: qty 1000

## 2014-12-30 ENCOUNTER — Ambulatory Visit: Payer: Managed Care, Other (non HMO)

## 2015-01-11 ENCOUNTER — Inpatient Hospital Stay: Payer: Managed Care, Other (non HMO)

## 2015-01-11 ENCOUNTER — Inpatient Hospital Stay (HOSPITAL_BASED_OUTPATIENT_CLINIC_OR_DEPARTMENT_OTHER): Payer: Managed Care, Other (non HMO) | Admitting: Oncology

## 2015-01-11 ENCOUNTER — Encounter: Payer: Self-pay | Admitting: Oncology

## 2015-01-11 VITALS — BP 107/76 | HR 80 | Resp 20 | Ht 73.0 in | Wt 317.7 lb

## 2015-01-11 DIAGNOSIS — C8191 Hodgkin lymphoma, unspecified, lymph nodes of head, face, and neck: Secondary | ICD-10-CM

## 2015-01-11 DIAGNOSIS — D649 Anemia, unspecified: Secondary | ICD-10-CM | POA: Diagnosis not present

## 2015-01-11 DIAGNOSIS — Z79899 Other long term (current) drug therapy: Secondary | ICD-10-CM | POA: Diagnosis not present

## 2015-01-11 DIAGNOSIS — C8190 Hodgkin lymphoma, unspecified, unspecified site: Secondary | ICD-10-CM

## 2015-01-11 LAB — COMPREHENSIVE METABOLIC PANEL
ALBUMIN: 4.2 g/dL (ref 3.5–5.0)
ALT: 50 U/L (ref 17–63)
AST: 33 U/L (ref 15–41)
Alkaline Phosphatase: 88 U/L (ref 38–126)
Anion gap: 7 (ref 5–15)
BUN: 14 mg/dL (ref 6–20)
CALCIUM: 8.7 mg/dL — AB (ref 8.9–10.3)
CHLORIDE: 104 mmol/L (ref 101–111)
CO2: 26 mmol/L (ref 22–32)
CREATININE: 1.1 mg/dL (ref 0.61–1.24)
GFR calc Af Amer: >60 mL/min (ref >60)
GFR calc non Af Amer: >60 mL/min (ref >60)
GLUCOSE: 91 mg/dL (ref 65–99)
Potassium: 3.4 mmol/L — ABNORMAL LOW (ref 3.5–5.1)
Sodium: 137 mmol/L (ref 135–145)
Total Bilirubin: 0.5 mg/dL (ref 0.3–1.2)
Total Protein: 7.5 g/dL (ref 6.5–8.1)

## 2015-01-11 LAB — CBC WITH DIFFERENTIAL/PLATELET
Basophils Absolute: 0 10*3/uL (ref 0–0.1)
Basophils Relative: 0 %
Eosinophils Absolute: 0.1 10*3/uL (ref 0–0.7)
Eosinophils Relative: 1 %
HCT: 38.1 % — ABNORMAL LOW (ref 40.0–52.0)
Hemoglobin: 12.4 g/dL — ABNORMAL LOW (ref 13.0–18.0)
Lymphocytes Relative: 12 %
Lymphs Abs: 1 10*3/uL (ref 1.0–3.6)
MCH: 24.7 pg — ABNORMAL LOW (ref 26.0–34.0)
MCHC: 32.6 g/dL (ref 32.0–36.0)
MCV: 75.6 fL — AB (ref 80.0–100.0)
MONOS PCT: 11 %
Monocytes Absolute: 1 10*3/uL (ref 0.2–1.0)
Neutro Abs: 7 10*3/uL — ABNORMAL HIGH (ref 1.4–6.5)
Neutrophils Relative %: 76 %
PLATELETS: 222 10*3/uL (ref 150–440)
RBC: 5.03 MIL/uL (ref 4.40–5.90)
RDW: 26.4 % — ABNORMAL HIGH (ref 11.5–14.5)
WBC: 9.1 10*3/uL (ref 3.8–10.6)

## 2015-01-11 MED ORDER — PEGFILGRASTIM 6 MG/0.6ML ~~LOC~~ PSKT
6.0000 mg | PREFILLED_SYRINGE | Freq: Once | SUBCUTANEOUS | Status: AC
  Administered 2015-01-11: 6 mg via SUBCUTANEOUS
  Filled 2015-01-11: qty 0.6

## 2015-01-11 MED ORDER — SODIUM CHLORIDE 0.9 % IV SOLN
375.0000 mg/m2 | Freq: Once | INTRAVENOUS | Status: AC
  Administered 2015-01-11: 1000 mg via INTRAVENOUS
  Filled 2015-01-11: qty 50

## 2015-01-11 MED ORDER — HEPARIN SOD (PORK) LOCK FLUSH 100 UNIT/ML IV SOLN
500.0000 [IU] | Freq: Once | INTRAVENOUS | Status: AC | PRN
  Administered 2015-01-11: 500 [IU]
  Filled 2015-01-11 (×2): qty 5

## 2015-01-11 MED ORDER — VINBLASTINE SULFATE CHEMO INJECTION 1 MG/ML
6.0000 mg/m2 | Freq: Once | INTRAVENOUS | Status: AC
  Administered 2015-01-11: 16 mg via INTRAVENOUS
  Filled 2015-01-11: qty 16

## 2015-01-11 MED ORDER — SODIUM CHLORIDE 0.9 % IV SOLN
Freq: Once | INTRAVENOUS | Status: AC
Start: 2015-01-11 — End: 2015-01-11
  Administered 2015-01-11: 11:00:00 via INTRAVENOUS
  Filled 2015-01-11: qty 1000

## 2015-01-11 MED ORDER — SODIUM CHLORIDE 0.9 % IV SOLN
Freq: Once | INTRAVENOUS | Status: AC
  Administered 2015-01-11: 11:00:00 via INTRAVENOUS
  Filled 2015-01-11: qty 8

## 2015-01-11 MED ORDER — PEGFILGRASTIM INJECTION 6 MG/0.6ML ~~LOC~~: 6.0000 mg | PREFILLED_SYRINGE | Freq: Once | SUBCUTANEOUS | Status: DC

## 2015-01-11 MED ORDER — SODIUM CHLORIDE 0.9 % IJ SOLN
10.0000 mL | INTRAMUSCULAR | Status: DC | PRN
  Administered 2015-01-11: 10 mL
  Filled 2015-01-11: qty 10

## 2015-01-11 MED ORDER — DOXORUBICIN HCL CHEMO IV INJECTION 2 MG/ML
25.0000 mg/m2 | Freq: Once | INTRAVENOUS | Status: AC
  Administered 2015-01-11: 66 mg via INTRAVENOUS
  Filled 2015-01-11: qty 33

## 2015-01-11 MED ORDER — BLEOMYCIN SULFATE CHEMO INJECTION 30 UNIT
10.0000 [IU]/m2 | Freq: Once | INTRAMUSCULAR | Status: AC
  Administered 2015-01-11: 27 [IU] via INTRAVENOUS
  Filled 2015-01-11: qty 9

## 2015-01-11 NOTE — Progress Notes (Unsigned)
Dr Grayland Ormond notified that Pt never has a blood return from [facility]. Reminded that [facility] requires a blood return every 3 ml. Dr Grayland Ormond states to go ahead with [facility].

## 2015-01-12 NOTE — Progress Notes (Signed)
[place]  [phone] [phone]  ID: Nicholas Ballard. OB: 29-Nov-1990  MR#: [id number]  BWL#:[id number]  Patient Care Team: [name], MD as PCP - General (Family Medicine)  CHIEF COMPLAINT:  Chief Complaint  Patient presents with  . Follow-up    Hodgkins Disease    INTERVAL HISTORY: Patient returns to clinic today for further evaluation and consideration of cycle 3, day 1 of ABVD. He is tolerating his treatments well without significant side effects. He currently feels well and is asymptomatic. He has no neurologic complaints. He denies any fevers, chills, night sweats, or weight loss. He denies any difficulty swallowing or dysphagia. He has no chest pain, shortness of breath, or cough. He denies any nausea, vomiting, constipation, or diarrhea. He has no urinary complaints. Patient offers no specific complaints today.   REVIEW OF SYSTEMS:   Review of Systems  Constitutional: Negative.   HENT:       Denies dysphagia.  Respiratory: Negative.   Cardiovascular: Negative.     As per HPI. Otherwise, a complete review of systems is negatve.  PAST MEDICAL HISTORY: Past Medical History  Diagnosis Date  . Neck mass     right   . Cancer     Hodgkins Lymphoma    PAST SURGICAL HISTORY: Past Surgical History  Procedure Laterality Date  . Peripheral vascular catheterization N/A 11/09/2014    Procedure: Glori Luis Cath Insertion;  Surgeon: Algernon Huxley, MD;  Location: [facility];  Service: Cardiovascular;  Laterality: N/A;    FAMILY HISTORY Family History  Problem Relation Age of Onset  . Cancer Paternal Grandfather     head and neck cancer  . Diabetes Other   . Hypertension Other   . Cancer Other     history of prostate and kidney cancer       ADVANCED DIRECTIVES:    HEALTH MAINTENANCE: History  Substance Use Topics  . Smoking status: Never Smoker   . Smokeless tobacco: Never Used  . Alcohol Use: No   Comment: occasional     Colonoscopy:  PAP:  Bone density:  Lipid panel:  No Known Allergies  Current Outpatient Prescriptions  Medication Sig Dispense Refill  . acetaminophen (TYLENOL) 500 MG tablet Take 500 mg by mouth every 6 (six) hours as needed.    Marland Kitchen HYDROcodone-acetaminophen (NORCO/VICODIN) 5-325 MG per tablet Take 1 tablet by mouth every 6 (six) hours as needed for moderate pain. 30 tablet 0  . lidocaine-prilocaine (EMLA) cream Apply to port area at least 1 hour prior to being accessed 30 g 0  . prochlorperazine (COMPAZINE) 10 MG tablet Take 1 tablet (10 mg total) by mouth every 6 (six) hours as needed for nausea or vomiting. 30 tablet 2   No current facility-administered medications for this visit.   Facility-Administered Medications Ordered in Other Visits  Medication Dose Route Frequency Provider Last Rate Last Dose  . sodium chloride 0.9 % injection 10 mL  10 mL Intracatheter PRN Lloyd Huger, MD   10 mL at 01/11/15 0950    OBJECTIVE: Filed Vitals:   01/11/15 1009  BP: 107/76  Pulse: 80  Resp: 20     Body mass index is 41.92 kg/(m^2).    ECOG FS:0 - Asymptomatic  General: Well-developed, well-nourished, no acute distress. Eyes: anicteric sclera. HEENT: Right neck/supraclavicular mass is minimally palpable Lungs: Clear to auscultation bilaterally. Heart: Regular rate and rhythm. No rubs, murmurs, or gallops. Abdomen: Soft, nontender, nondistended. No organomegaly noted,  normoactive bowel sounds. Musculoskeletal: No edema, cyanosis, or clubbing. Neuro: Alert, answering all questions appropriately. Cranial nerves grossly intact. Skin: No rashes or petechiae noted. Psych: Normal affect.    LAB RESULTS:  Lab Results  Component Value Date   NA 137 01/11/2015   K 3.4* 01/11/2015   CL 104 01/11/2015   CO2 26 01/11/2015   GLUCOSE 91 01/11/2015   BUN 14 01/11/2015   CREATININE 1.10 01/11/2015   CALCIUM 8.7* 01/11/2015   PROT 7.5 01/11/2015   ALBUMIN  4.2 01/11/2015   AST 33 01/11/2015   ALT 50 01/11/2015   ALKPHOS 88 01/11/2015   BILITOT 0.5 01/11/2015   GFRNONAA >60 01/11/2015   GFRAA >60 01/11/2015    Lab Results  Component Value Date   WBC 9.1 01/11/2015   NEUTROABS 7.0* 01/11/2015   HGB 12.4* 01/11/2015   HCT 38.1* 01/11/2015   MCV 75.6* 01/11/2015   PLT 222 01/11/2015     STUDIES: No results found.  ASSESSMENT: Stage II Hodgkin's lymphoma.  PLAN:    1. Hodgkin's lymphoma: PET scan results reviewed independently. Patient does not have any disease below his diaphragm. Bone marrow biopsy reported as negative for disease. MUGA scan and PFTs adequate to proceed with treatment. Proceed with cycle 3, day 1 of ABVD today. Return to clinic in 2 days for Neulasta and then in 2 weeks for consideration of cycle 3, day 15.  2. Leukopenia: Resolved. Secondary to chemotherapy, Neulasta as above. 3. Anemia: Mild, monitor.  Patient expressed understanding and was in agreement with this plan. He also understands that He can call clinic at any time with any questions, concerns, or complaints.   No matching staging information was found for the patient.  Lloyd Huger, MD   01/12/2015 5:04 PM

## 2015-01-12 NOTE — Progress Notes (Signed)
Nicholas Ballard  [phone] [phone]  ID: Collier Flowers [name]. OB: 10-09-1990  MR#: [id number]  HCW#:[id number]  Patient Care Team: [name], MD as PCP - General (Family Medicine)  CHIEF COMPLAINT:  Chief Complaint  Patient presents with  . Lymphoma  . Follow-up    INTERVAL HISTORY: Patient returns to clinic today for further evaluation and consideration of cycle 2, day 15 of ABVD. He is tolerating his treatments well without significant side effects. He currently feels well and is asymptomatic. He has no neurologic complaints. He denies any fevers, chills, night sweats, or weight loss. He denies any difficulty swallowing or dysphagia. He has no chest pain, shortness of breath, or cough. He denies any nausea, vomiting, constipation, or diarrhea. He has no urinary complaints. Patient offers no specific complaints today.   REVIEW OF SYSTEMS:   Review of Systems  Constitutional: Negative.   HENT:       Denies dysphagia.  Respiratory: Negative.     As per HPI. Otherwise, a complete review of systems is negatve.  PAST MEDICAL HISTORY: Past Medical History  Diagnosis Date  . Neck mass     right   . Cancer     Hodgkins Lymphoma    PAST SURGICAL HISTORY: Past Surgical History  Procedure Laterality Date  . Peripheral vascular catheterization N/A 11/09/2014    Procedure: Glori Luis Cath Insertion;  Surgeon: Algernon Huxley, MD;  Location: [facility];  Service: Cardiovascular;  Laterality: N/A;    FAMILY HISTORY Family History  Problem Relation Age of Onset  . Cancer Paternal Grandfather     head and neck cancer  . Diabetes Other   . Hypertension Other   . Cancer Other     history of prostate and kidney cancer       ADVANCED DIRECTIVES:    HEALTH MAINTENANCE: History  Substance Use Topics  . Smoking status: Never Smoker   . Smokeless tobacco: Never Used  . Alcohol Use: No     Comment: occasional      Colonoscopy:  PAP:  Bone density:  Lipid panel:  No Known Allergies  Current Outpatient Prescriptions  Medication Sig Dispense Refill  . acetaminophen (TYLENOL) 500 MG tablet Take 500 mg by mouth every 6 (six) hours as needed.    Marland Kitchen HYDROcodone-acetaminophen (NORCO/VICODIN) 5-325 MG per tablet Take 1 tablet by mouth every 6 (six) hours as needed for moderate pain. 30 tablet 0  . lidocaine-prilocaine (EMLA) cream Apply to port area at least 1 hour prior to being accessed 30 g 0  . prochlorperazine (COMPAZINE) 10 MG tablet Take 1 tablet (10 mg total) by mouth every 6 (six) hours as needed for nausea or vomiting. 30 tablet 2   No current facility-administered medications for this visit.   Facility-Administered Medications Ordered in Other Visits  Medication Dose Route Frequency Provider Last Rate Last Dose  . sodium chloride 0.9 % injection 10 mL  10 mL Intracatheter PRN Lloyd Huger, MD   10 mL at 01/11/15 0950    OBJECTIVE: Filed Vitals:   12/28/14 0949  BP: 109/67  Pulse: 74  Temp: 95.6 F (35.3 C)  Resp: 18     Body mass index is 42.87 kg/(m^2).    ECOG FS:0 - Asymptomatic  General: Well-developed, well-nourished, no acute distress. Eyes: anicteric sclera. HEENT: Right neck/supraclavicular mass is minimally palpable Lungs: Clear to auscultation bilaterally. Heart: Regular rate and rhythm. No rubs, murmurs, or gallops. Abdomen: Soft, nontender, nondistended.  No organomegaly noted, normoactive bowel sounds. Musculoskeletal: No edema, cyanosis, or clubbing. Neuro: Alert, answering all questions appropriately. Cranial nerves grossly intact. Skin: No rashes or petechiae noted. Psych: Normal affect.    LAB RESULTS:  Lab Results  Component Value Date   NA 137 01/11/2015   K 3.4* 01/11/2015   CL 104 01/11/2015   CO2 26 01/11/2015   GLUCOSE 91 01/11/2015   BUN 14 01/11/2015   CREATININE 1.10 01/11/2015   CALCIUM 8.7* 01/11/2015   PROT 7.5 01/11/2015    ALBUMIN 4.2 01/11/2015   AST 33 01/11/2015   ALT 50 01/11/2015   ALKPHOS 88 01/11/2015   BILITOT 0.5 01/11/2015   GFRNONAA >60 01/11/2015   GFRAA >60 01/11/2015    Lab Results  Component Value Date   WBC 9.1 01/11/2015   NEUTROABS 7.0* 01/11/2015   HGB 12.4* 01/11/2015   HCT 38.1* 01/11/2015   MCV 75.6* 01/11/2015   PLT 222 01/11/2015     STUDIES: No results found.  ASSESSMENT: Stage II Hodgkin's lymphoma.  PLAN:    1. Hodgkin's lymphoma: PET scan results reviewed independently. Patient does not have any disease below his diaphragm. Bone marrow biopsy reported as negative for disease. MUGA scan and PFTs adequate to proceed with treatment. Proceed with cycle 2, day 15 of ABVD today. Return to clinic in 2 days for Neulasta and then in 2 weeks for consideration of cycle 3, day 1.  2. Leukopenia: Resolved. Secondary to chemotherapy, Neulasta as above. 3. Anemia: Mild, monitor.  Patient expressed understanding and was in agreement with this plan. He also understands that He can call clinic at any time with any questions, concerns, or complaints.   No matching staging information was found for the patient.  Lloyd Huger, MD   01/12/2015 5:02 PM

## 2015-01-13 ENCOUNTER — Ambulatory Visit: Payer: Managed Care, Other (non HMO)

## 2015-01-25 ENCOUNTER — Inpatient Hospital Stay: Payer: Managed Care, Other (non HMO) | Attending: Oncology

## 2015-01-25 ENCOUNTER — Inpatient Hospital Stay: Payer: Managed Care, Other (non HMO)

## 2015-01-25 ENCOUNTER — Inpatient Hospital Stay (HOSPITAL_BASED_OUTPATIENT_CLINIC_OR_DEPARTMENT_OTHER): Payer: Managed Care, Other (non HMO) | Admitting: Oncology

## 2015-01-25 VITALS — BP 130/76 | HR 84 | Temp 97.0°F | Resp 20

## 2015-01-25 VITALS — BP 132/72 | HR 81 | Temp 96.9°F | Resp 18 | Wt 323.4 lb

## 2015-01-25 DIAGNOSIS — Z5111 Encounter for antineoplastic chemotherapy: Secondary | ICD-10-CM | POA: Diagnosis not present

## 2015-01-25 DIAGNOSIS — Z79899 Other long term (current) drug therapy: Secondary | ICD-10-CM

## 2015-01-25 DIAGNOSIS — D649 Anemia, unspecified: Secondary | ICD-10-CM

## 2015-01-25 DIAGNOSIS — C8191 Hodgkin lymphoma, unspecified, lymph nodes of head, face, and neck: Secondary | ICD-10-CM | POA: Insufficient documentation

## 2015-01-25 DIAGNOSIS — C8190 Hodgkin lymphoma, unspecified, unspecified site: Secondary | ICD-10-CM

## 2015-01-25 DIAGNOSIS — Z418 Encounter for other procedures for purposes other than remedying health state: Secondary | ICD-10-CM | POA: Diagnosis not present

## 2015-01-25 LAB — CBC WITH DIFFERENTIAL/PLATELET
BASOS ABS: 0 10*3/uL (ref 0–0.1)
BASOS PCT: 0 %
EOS PCT: 1 %
Eosinophils Absolute: 0.1 10*3/uL (ref 0–0.7)
HEMATOCRIT: 35.9 % — AB (ref 40.0–52.0)
HEMOGLOBIN: 11.8 g/dL — AB (ref 13.0–18.0)
LYMPHS ABS: 1.3 10*3/uL (ref 1.0–3.6)
LYMPHS PCT: 18 %
MCH: 25.2 pg — ABNORMAL LOW (ref 26.0–34.0)
MCHC: 33 g/dL (ref 32.0–36.0)
MCV: 76.5 fL — ABNORMAL LOW (ref 80.0–100.0)
Monocytes Absolute: 0.5 10*3/uL (ref 0.2–1.0)
Monocytes Relative: 7 %
Neutro Abs: 5.4 10*3/uL (ref 1.4–6.5)
Neutrophils Relative %: 74 %
PLATELETS: 211 10*3/uL (ref 150–440)
RBC: 4.69 MIL/uL (ref 4.40–5.90)
RDW: 27.4 % — ABNORMAL HIGH (ref 11.5–14.5)
WBC: 7.3 10*3/uL (ref 3.8–10.6)

## 2015-01-25 LAB — COMPREHENSIVE METABOLIC PANEL
ALT: 38 U/L (ref 17–63)
AST: 27 U/L (ref 15–41)
Albumin: 4 g/dL (ref 3.5–5.0)
Alkaline Phosphatase: 83 U/L (ref 38–126)
Anion gap: 7 (ref 5–15)
BUN: 7 mg/dL (ref 6–20)
CO2: 26 mmol/L (ref 22–32)
Calcium: 8.8 mg/dL — ABNORMAL LOW (ref 8.9–10.3)
Chloride: 104 mmol/L (ref 101–111)
Creatinine, Ser: 1.08 mg/dL (ref 0.61–1.24)
GFR calc Af Amer: >60 mL/min (ref >60)
GFR calc non Af Amer: >60 mL/min (ref >60)
Glucose, Bld: 128 mg/dL — ABNORMAL HIGH (ref 65–99)
POTASSIUM: 3.4 mmol/L — AB (ref 3.5–5.1)
SODIUM: 137 mmol/L (ref 135–145)
Total Bilirubin: 0.4 mg/dL (ref 0.3–1.2)
Total Protein: 7.2 g/dL (ref 6.5–8.1)

## 2015-01-25 MED ORDER — HEPARIN SOD (PORK) LOCK FLUSH 100 UNIT/ML IV SOLN
500.0000 [IU] | Freq: Once | INTRAVENOUS | Status: AC
  Administered 2015-01-25: 500 [IU] via INTRAVENOUS

## 2015-01-25 MED ORDER — SODIUM CHLORIDE 0.9 % IV SOLN
Freq: Once | INTRAVENOUS | Status: AC
  Administered 2015-01-25: 10:00:00 via INTRAVENOUS
  Filled 2015-01-25: qty 8

## 2015-01-25 MED ORDER — SODIUM CHLORIDE 0.9 % IV SOLN
375.0000 mg/m2 | Freq: Once | INTRAVENOUS | Status: AC
  Administered 2015-01-25: 1000 mg via INTRAVENOUS
  Filled 2015-01-25: qty 50

## 2015-01-25 MED ORDER — SODIUM CHLORIDE 0.9 % IV SOLN
10.0000 [IU]/m2 | Freq: Once | INTRAVENOUS | Status: AC
  Administered 2015-01-25: 27 [IU] via INTRAVENOUS
  Filled 2015-01-25: qty 9

## 2015-01-25 MED ORDER — HEPARIN SOD (PORK) LOCK FLUSH 100 UNIT/ML IV SOLN
INTRAVENOUS | Status: AC
  Filled 2015-01-25: qty 5

## 2015-01-25 MED ORDER — SODIUM CHLORIDE 0.9 % IJ SOLN
10.0000 mL | INTRAMUSCULAR | Status: DC | PRN
Start: 2015-01-25 — End: 2015-01-25
  Filled 2015-01-25: qty 10

## 2015-01-25 MED ORDER — DOXORUBICIN HCL CHEMO IV INJECTION 2 MG/ML
25.0000 mg/m2 | Freq: Once | INTRAVENOUS | Status: AC
  Administered 2015-01-25: 66 mg via INTRAVENOUS
  Filled 2015-01-25: qty 33

## 2015-01-25 MED ORDER — PEGFILGRASTIM 6 MG/0.6ML ~~LOC~~ PSKT
6.0000 mg | PREFILLED_SYRINGE | Freq: Once | SUBCUTANEOUS | Status: AC
  Administered 2015-01-25: 6 mg via SUBCUTANEOUS
  Filled 2015-01-25: qty 0.6

## 2015-01-25 MED ORDER — SODIUM CHLORIDE 0.9 % IV SOLN
Freq: Once | INTRAVENOUS | Status: AC
  Administered 2015-01-25: 10:00:00 via INTRAVENOUS
  Filled 2015-01-25: qty 1000

## 2015-01-25 MED ORDER — VINBLASTINE SULFATE CHEMO INJECTION 1 MG/ML
6.0000 mg/m2 | Freq: Once | INTRAVENOUS | Status: AC
  Administered 2015-01-25: 16 mg via INTRAVENOUS
  Filled 2015-01-25: qty 16

## 2015-01-27 ENCOUNTER — Inpatient Hospital Stay: Payer: Managed Care, Other (non HMO)

## 2015-01-30 NOTE — Progress Notes (Signed)
[facility]  Telephone:([phone] [phone]  ID: Collier Flowers [name]. OB: [birth year]  MR#: [id number]  DUK#:[id number]  Patient Care Team: [name], MD as PCP - General (Family Medicine)  CHIEF COMPLAINT:  Chief Complaint  Patient presents with  . Follow-up    hodgkin's lymphoma  . Chemotherapy    INTERVAL HISTORY: Patient returns to clinic today for further evaluation and consideration of cycle 3, day 15 of ABVD. He continues to tolerate his treatments well without significant side effects. He currently feels well and is asymptomatic. He has no neurologic complaints. He denies any fevers, chills, night sweats, or weight loss. He denies any difficulty swallowing or dysphagia. He has no chest pain, shortness of breath, or cough. He denies any nausea, vomiting, constipation, or diarrhea. He has no urinary complaints. Patient offers no specific complaints today.   REVIEW OF SYSTEMS:   Review of Systems  Constitutional: Negative.   HENT:       Denies dysphagia.  Respiratory: Negative.   Cardiovascular: Negative.     As per HPI. Otherwise, a complete review of systems is negatve.  PAST MEDICAL HISTORY: Past Medical History  Diagnosis Date  . Neck mass     right   . Cancer     Hodgkins Lymphoma    PAST SURGICAL HISTORY: Past Surgical History  Procedure Laterality Date  . Peripheral vascular catheterization N/A 11/09/2014    Procedure: Glori Luis Cath Insertion;  Surgeon: Algernon Huxley, MD;  Location: [facility];  Service: Cardiovascular;  Laterality: N/A;    FAMILY HISTORY Family History  Problem Relation Age of Onset  . Cancer Paternal Grandfather     head and neck cancer  . Diabetes Other   . Hypertension Other   . Cancer Other     history of prostate and kidney cancer       ADVANCED DIRECTIVES:    HEALTH MAINTENANCE: History  Substance Use Topics  . Smoking status: Never Smoker   . Smokeless tobacco: Never Used    . Alcohol Use: No     Comment: occasional     Colonoscopy:  PAP:  Bone density:  Lipid panel:  No Known Allergies  Current Outpatient Prescriptions  Medication Sig Dispense Refill  . acetaminophen (TYLENOL) 500 MG tablet Take 500 mg by mouth every 6 (six) hours as needed.    Marland Kitchen HYDROcodone-acetaminophen (NORCO/VICODIN) 5-325 MG per tablet Take 1 tablet by mouth every 6 (six) hours as needed for moderate pain. 30 tablet 0  . lidocaine-prilocaine (EMLA) cream Apply to port area at least 1 hour prior to being accessed 30 g 0  . prochlorperazine (COMPAZINE) 10 MG tablet Take 1 tablet (10 mg total) by mouth every 6 (six) hours as needed for nausea or vomiting. 30 tablet 2   No current facility-administered medications for this visit.   Facility-Administered Medications Ordered in Other Visits  Medication Dose Route Frequency Provider Last Rate Last Dose  . sodium chloride 0.9 % injection 10 mL  10 mL Intracatheter PRN Lloyd Huger, MD   10 mL at 01/11/15 0950    OBJECTIVE: Filed Vitals:   01/25/15 0938  BP: 132/72  Pulse: 81  Temp: 96.9 F (36.1 C)  Resp: 18     Body mass index is 42.68 kg/(m^2).    ECOG FS:0 - Asymptomatic  General: Well-developed, well-nourished, no acute distress. Eyes: anicteric sclera. HEENT: Right neck/supraclavicular mass is no longer palpable Lungs: Clear to auscultation bilaterally. Heart: Regular  rate and rhythm. No rubs, murmurs, or gallops. Abdomen: Soft, nontender, nondistended. No organomegaly noted, normoactive bowel sounds. Musculoskeletal: No edema, cyanosis, or clubbing. Neuro: Alert, answering all questions appropriately. Cranial nerves grossly intact. Skin: No rashes or petechiae noted. Psych: Normal affect.    LAB RESULTS:  Lab Results  Component Value Date   NA 137 01/25/2015   K 3.4* 01/25/2015   CL 104 01/25/2015   CO2 26 01/25/2015   GLUCOSE 128* 01/25/2015   BUN 7 01/25/2015   CREATININE 1.08 01/25/2015   CALCIUM  8.8* 01/25/2015   PROT 7.2 01/25/2015   ALBUMIN 4.0 01/25/2015   AST 27 01/25/2015   ALT 38 01/25/2015   ALKPHOS 83 01/25/2015   BILITOT 0.4 01/25/2015   GFRNONAA >60 01/25/2015   GFRAA >60 01/25/2015    Lab Results  Component Value Date   WBC 7.3 01/25/2015   NEUTROABS 5.4 01/25/2015   HGB 11.8* 01/25/2015   HCT 35.9* 01/25/2015   MCV 76.5* 01/25/2015   PLT 211 01/25/2015     STUDIES: No results found.  ASSESSMENT: Stage II Hodgkin's lymphoma.  PLAN:    1. Hodgkin's lymphoma: PET scan results reviewed independently. Patient does not have any disease below his diaphragm. Bone marrow biopsy reported as negative for disease. MUGA scan and PFTs adequate to proceed with treatment. Proceed with cycle 3, day 15 of ABVD today. Return to clinic in 2 days for Neulasta and then in 2 weeks for consideration of cycle 4, day 1.  2. Leukopenia: Resolved. Secondary to chemotherapy, Neulasta as above. 3. Anemia: Mild, monitor.  Patient expressed understanding and was in agreement with this plan. He also understands that He can call clinic at any time with any questions, concerns, or complaints.   No matching staging information was found for the patient.  Lloyd Huger, MD   01/30/2015 12:46 PM

## 2015-02-08 ENCOUNTER — Inpatient Hospital Stay: Payer: Managed Care, Other (non HMO)

## 2015-02-08 ENCOUNTER — Inpatient Hospital Stay (HOSPITAL_BASED_OUTPATIENT_CLINIC_OR_DEPARTMENT_OTHER): Payer: Managed Care, Other (non HMO) | Admitting: Oncology

## 2015-02-08 VITALS — BP 126/81 | HR 97 | Temp 96.0°F | Resp 16 | Wt 319.2 lb

## 2015-02-08 DIAGNOSIS — D649 Anemia, unspecified: Secondary | ICD-10-CM

## 2015-02-08 DIAGNOSIS — C8191 Hodgkin lymphoma, unspecified, lymph nodes of head, face, and neck: Secondary | ICD-10-CM

## 2015-02-08 DIAGNOSIS — Z418 Encounter for other procedures for purposes other than remedying health state: Secondary | ICD-10-CM | POA: Diagnosis not present

## 2015-02-08 DIAGNOSIS — C8190 Hodgkin lymphoma, unspecified, unspecified site: Secondary | ICD-10-CM

## 2015-02-08 DIAGNOSIS — Z79899 Other long term (current) drug therapy: Secondary | ICD-10-CM

## 2015-02-08 LAB — COMPREHENSIVE METABOLIC PANEL
ALK PHOS: 92 U/L (ref 38–126)
ALT: 45 U/L (ref 17–63)
ANION GAP: 9 (ref 5–15)
AST: 36 U/L (ref 15–41)
Albumin: 4.2 g/dL (ref 3.5–5.0)
BUN: 7 mg/dL (ref 6–20)
CALCIUM: 8.8 mg/dL — AB (ref 8.9–10.3)
CO2: 24 mmol/L (ref 22–32)
CREATININE: 1.11 mg/dL (ref 0.61–1.24)
Chloride: 105 mmol/L (ref 101–111)
Glucose, Bld: 128 mg/dL — ABNORMAL HIGH (ref 65–99)
Potassium: 3.3 mmol/L — ABNORMAL LOW (ref 3.5–5.1)
Sodium: 138 mmol/L (ref 135–145)
Total Bilirubin: 0.4 mg/dL (ref 0.3–1.2)
Total Protein: 7.4 g/dL (ref 6.5–8.1)

## 2015-02-08 LAB — CBC WITH DIFFERENTIAL/PLATELET
Basophils Absolute: 0 10*3/uL (ref 0–0.1)
Basophils Relative: 0 %
EOS ABS: 0.1 10*3/uL (ref 0–0.7)
Eosinophils Relative: 1 %
HCT: 36.8 % — ABNORMAL LOW (ref 40.0–52.0)
HEMOGLOBIN: 12.5 g/dL — AB (ref 13.0–18.0)
LYMPHS ABS: 1.1 10*3/uL (ref 1.0–3.6)
Lymphocytes Relative: 11 %
MCH: 26 pg (ref 26.0–34.0)
MCHC: 33.9 g/dL (ref 32.0–36.0)
MCV: 76.6 fL — AB (ref 80.0–100.0)
Monocytes Absolute: 0.8 10*3/uL (ref 0.2–1.0)
Monocytes Relative: 8 %
Neutro Abs: 7.9 10*3/uL — ABNORMAL HIGH (ref 1.4–6.5)
Neutrophils Relative %: 80 %
PLATELETS: 249 10*3/uL (ref 150–440)
RBC: 4.8 MIL/uL (ref 4.40–5.90)
RDW: 27.4 % — ABNORMAL HIGH (ref 11.5–14.5)
WBC: 10 10*3/uL (ref 3.8–10.6)

## 2015-02-08 MED ORDER — DOXORUBICIN HCL CHEMO IV INJECTION 2 MG/ML
25.0000 mg/m2 | Freq: Once | INTRAVENOUS | Status: AC
  Administered 2015-02-08: 66 mg via INTRAVENOUS
  Filled 2015-02-08: qty 33

## 2015-02-08 MED ORDER — SODIUM CHLORIDE 0.9 % IV SOLN
Freq: Once | INTRAVENOUS | Status: AC
  Administered 2015-02-08: 10:00:00 via INTRAVENOUS
  Filled 2015-02-08: qty 1000

## 2015-02-08 MED ORDER — SODIUM CHLORIDE 0.9 % IV SOLN
Freq: Once | INTRAVENOUS | Status: AC
  Administered 2015-02-08: 10:00:00 via INTRAVENOUS
  Filled 2015-02-08: qty 8

## 2015-02-08 MED ORDER — SODIUM CHLORIDE 0.9 % IV SOLN
10.0000 [IU]/m2 | Freq: Once | INTRAVENOUS | Status: AC
  Administered 2015-02-08: 27 [IU] via INTRAVENOUS
  Filled 2015-02-08: qty 9

## 2015-02-08 MED ORDER — SODIUM CHLORIDE 0.9 % IV SOLN
375.0000 mg/m2 | Freq: Once | INTRAVENOUS | Status: AC
  Administered 2015-02-08: 1000 mg via INTRAVENOUS
  Filled 2015-02-08: qty 50

## 2015-02-08 MED ORDER — SODIUM CHLORIDE 0.9 % IJ SOLN
10.0000 mL | INTRAMUSCULAR | Status: DC | PRN
  Filled 2015-02-08: qty 10

## 2015-02-08 MED ORDER — PEGFILGRASTIM 6 MG/0.6ML ~~LOC~~ PSKT
6.0000 mg | PREFILLED_SYRINGE | Freq: Once | SUBCUTANEOUS | Status: AC
  Administered 2015-02-08: 6 mg via SUBCUTANEOUS

## 2015-02-08 MED ORDER — HEPARIN SOD (PORK) LOCK FLUSH 100 UNIT/ML IV SOLN
500.0000 [IU] | Freq: Once | INTRAVENOUS | Status: AC | PRN
  Administered 2015-02-08: 500 [IU]
  Filled 2015-02-08: qty 5

## 2015-02-08 MED ORDER — VINBLASTINE SULFATE CHEMO INJECTION 1 MG/ML
6.0000 mg/m2 | Freq: Once | INTRAVENOUS | Status: AC
  Administered 2015-02-08: 16 mg via INTRAVENOUS
  Filled 2015-02-08: qty 16

## 2015-02-08 MED ORDER — PEGFILGRASTIM INJECTION 6 MG/0.6ML ~~LOC~~
6.0000 mg | PREFILLED_SYRINGE | Freq: Once | SUBCUTANEOUS | Status: DC
  Filled 2015-02-08: qty 0.6

## 2015-02-11 NOTE — Progress Notes (Signed)
[facility]  [phone] [phone]  ID: Nicholas Ballard. OB: 10-17-1990  MR#: [id number]  EBR#:[id number]  Patient Care Team: [name], MD as PCP - General (Family Medicine)  CHIEF COMPLAINT:  Chief Complaint  Patient presents with  . Follow-up    Hodgkin's lymphoma    INTERVAL HISTORY: Patient returns to clinic today for further evaluation and consideration of cycle 4, day 1 of ABVD. He continues to tolerate his treatments well without significant side effects. He currently feels well and is asymptomatic. He has no neurologic complaints. He denies any fevers, chills, night sweats, or weight loss. He denies any difficulty swallowing or dysphagia. He has no chest pain, shortness of breath, or cough. He denies any nausea, vomiting, constipation, or diarrhea. He has no urinary complaints. Patient offers no specific complaints today.   REVIEW OF SYSTEMS:   Review of Systems  Constitutional: Negative.   HENT:       Denies dysphagia.  Respiratory: Negative.   Cardiovascular: Negative.     As per HPI. Otherwise, a complete review of systems is negatve.  PAST MEDICAL HISTORY: Past Medical History  Diagnosis Date  . Neck mass     right   . Cancer     Hodgkins Lymphoma    PAST SURGICAL HISTORY: Past Surgical History  Procedure Laterality Date  . Peripheral vascular catheterization N/A 11/09/2014    Procedure: Glori Luis Cath Insertion;  Surgeon: Algernon Huxley, MD;  Location: [facility];  Service: Cardiovascular;  Laterality: N/A;    FAMILY HISTORY Family History  Problem Relation Age of Onset  . Cancer Paternal Grandfather     head and neck cancer  . Diabetes Other   . Hypertension Other   . Cancer Other     history of prostate and kidney cancer       ADVANCED DIRECTIVES:    HEALTH MAINTENANCE: Social History  Substance Use Topics  . Smoking status: Never Smoker   . Smokeless tobacco: Never Used  . Alcohol  Use: No     Comment: occasional     Colonoscopy:  PAP:  Bone density:  Lipid panel:  No Known Allergies  Current Outpatient Prescriptions  Medication Sig Dispense Refill  . acetaminophen (TYLENOL) 500 MG tablet Take 500 mg by mouth every 6 (six) hours as needed.    Marland Kitchen HYDROcodone-acetaminophen (NORCO/VICODIN) 5-325 MG per tablet Take 1 tablet by mouth every 6 (six) hours as needed for moderate pain. 30 tablet 0  . lidocaine-prilocaine (EMLA) cream Apply to port area at least 1 hour prior to being accessed 30 g 0  . prochlorperazine (COMPAZINE) 10 MG tablet Take 1 tablet (10 mg total) by mouth every 6 (six) hours as needed for nausea or vomiting. 30 tablet 2   No current facility-administered medications for this visit.   Facility-Administered Medications Ordered in Other Visits  Medication Dose Route Frequency Provider Last Rate Last Dose  . sodium chloride 0.9 % injection 10 mL  10 mL Intracatheter PRN Lloyd Huger, MD   10 mL at 01/11/15 0950    OBJECTIVE: Filed Vitals:   02/08/15 0922  BP: 126/81  Pulse: 97  Temp: 96 F (35.6 C)  Resp: 16     Body mass index is 42.13 kg/(m^2).    ECOG FS:0 - Asymptomatic  General: Well-developed, well-nourished, no acute distress. Eyes: anicteric sclera. HEENT: Right neck/supraclavicular mass is no longer palpable Lungs: Clear to auscultation bilaterally. Heart: Regular rate and rhythm.  No rubs, murmurs, or gallops. Abdomen: Soft, nontender, nondistended. No organomegaly noted, normoactive bowel sounds. Musculoskeletal: No edema, cyanosis, or clubbing. Neuro: Alert, answering all questions appropriately. Cranial nerves grossly intact. Skin: No rashes or petechiae noted. Psych: Normal affect.    LAB RESULTS:  Lab Results  Component Value Date   NA 138 02/08/2015   K 3.3* 02/08/2015   CL 105 02/08/2015   CO2 24 02/08/2015   GLUCOSE 128* 02/08/2015   BUN 7 02/08/2015   CREATININE 1.11 02/08/2015   CALCIUM 8.8*  02/08/2015   PROT 7.4 02/08/2015   ALBUMIN 4.2 02/08/2015   AST 36 02/08/2015   ALT 45 02/08/2015   ALKPHOS 92 02/08/2015   BILITOT 0.4 02/08/2015   GFRNONAA >60 02/08/2015   GFRAA >60 02/08/2015    Lab Results  Component Value Date   WBC 10.0 02/08/2015   NEUTROABS 7.9* 02/08/2015   HGB 12.5* 02/08/2015   HCT 36.8* 02/08/2015   MCV 76.6* 02/08/2015   PLT 249 02/08/2015     STUDIES: No results found.  ASSESSMENT: Stage II Hodgkin's lymphoma.  PLAN:    1. Hodgkin's lymphoma: PET scan results reviewed independently. Patient does not have any disease below his diaphragm. Bone marrow biopsy reported as negative for disease. MUGA scan and PFTs adequate to proceed with treatment. Proceed with cycle 4, day 1 of ABVD today. Return to clinic in 2 days for Neulasta and then in 2 weeks for consideration of cycle 4, day 15.  2. Leukopenia: Resolved. Secondary to chemotherapy, Neulasta as above. 3. Anemia: Mild, monitor.  Patient expressed understanding and was in agreement with this plan. He also understands that He can call clinic at any time with any questions, concerns, or complaints.    Lloyd Huger, MD   02/11/2015 10:14 AM

## 2015-02-22 ENCOUNTER — Inpatient Hospital Stay: Payer: Managed Care, Other (non HMO)

## 2015-02-22 ENCOUNTER — Inpatient Hospital Stay: Payer: Managed Care, Other (non HMO) | Attending: Oncology

## 2015-02-22 ENCOUNTER — Inpatient Hospital Stay (HOSPITAL_BASED_OUTPATIENT_CLINIC_OR_DEPARTMENT_OTHER): Payer: Managed Care, Other (non HMO) | Admitting: Oncology

## 2015-02-22 ENCOUNTER — Telehealth: Payer: Self-pay | Admitting: Pharmacist

## 2015-02-22 VITALS — BP 117/76 | HR 84 | Temp 96.2°F | Resp 20 | Wt 317.9 lb

## 2015-02-22 DIAGNOSIS — C8190 Hodgkin lymphoma, unspecified, unspecified site: Secondary | ICD-10-CM

## 2015-02-22 DIAGNOSIS — Z79899 Other long term (current) drug therapy: Secondary | ICD-10-CM | POA: Insufficient documentation

## 2015-02-22 DIAGNOSIS — D649 Anemia, unspecified: Secondary | ICD-10-CM

## 2015-02-22 DIAGNOSIS — Z5111 Encounter for antineoplastic chemotherapy: Secondary | ICD-10-CM | POA: Diagnosis not present

## 2015-02-22 DIAGNOSIS — Z418 Encounter for other procedures for purposes other than remedying health state: Secondary | ICD-10-CM | POA: Diagnosis not present

## 2015-02-22 DIAGNOSIS — C801 Malignant (primary) neoplasm, unspecified: Secondary | ICD-10-CM

## 2015-02-22 LAB — COMPREHENSIVE METABOLIC PANEL
ALBUMIN: 4.1 g/dL (ref 3.5–5.0)
ALT: 35 U/L (ref 17–63)
ANION GAP: 6 (ref 5–15)
AST: 26 U/L (ref 15–41)
Alkaline Phosphatase: 90 U/L (ref 38–126)
BUN: 11 mg/dL (ref 6–20)
CHLORIDE: 106 mmol/L (ref 101–111)
CO2: 26 mmol/L (ref 22–32)
Calcium: 8.9 mg/dL (ref 8.9–10.3)
Creatinine, Ser: 1.19 mg/dL (ref 0.61–1.24)
GFR calc Af Amer: >60 mL/min (ref >60)
GFR calc non Af Amer: >60 mL/min (ref >60)
GLUCOSE: 112 mg/dL — AB (ref 65–99)
POTASSIUM: 3.7 mmol/L (ref 3.5–5.1)
SODIUM: 138 mmol/L (ref 135–145)
Total Bilirubin: 0.4 mg/dL (ref 0.3–1.2)
Total Protein: 7.5 g/dL (ref 6.5–8.1)

## 2015-02-22 LAB — CBC WITH DIFFERENTIAL/PLATELET
BASOS PCT: 0 %
Basophils Absolute: 0 10*3/uL (ref 0–0.1)
EOS ABS: 0.1 10*3/uL (ref 0–0.7)
Eosinophils Relative: 1 %
HCT: 37.2 % — ABNORMAL LOW (ref 40.0–52.0)
HEMOGLOBIN: 12.5 g/dL — AB (ref 13.0–18.0)
Lymphocytes Relative: 11 %
Lymphs Abs: 1.1 10*3/uL (ref 1.0–3.6)
MCH: 26.2 pg (ref 26.0–34.0)
MCHC: 33.6 g/dL (ref 32.0–36.0)
MCV: 77.9 fL — ABNORMAL LOW (ref 80.0–100.0)
MONOS PCT: 9 %
Monocytes Absolute: 0.9 10*3/uL (ref 0.2–1.0)
NEUTROS PCT: 79 %
Neutro Abs: 7.9 10*3/uL — ABNORMAL HIGH (ref 1.4–6.5)
PLATELETS: 251 10*3/uL (ref 150–440)
RBC: 4.78 MIL/uL (ref 4.40–5.90)
RDW: 25.9 % — AB (ref 11.5–14.5)
WBC: 10 10*3/uL (ref 3.8–10.6)

## 2015-02-22 MED ORDER — SODIUM CHLORIDE 0.9 % IV SOLN
Freq: Once | INTRAVENOUS | Status: AC
  Administered 2015-02-22: 10:00:00 via INTRAVENOUS
  Filled 2015-02-22: qty 8

## 2015-02-22 MED ORDER — SODIUM CHLORIDE 0.9 % IV SOLN
Freq: Once | INTRAVENOUS | Status: AC
  Administered 2015-02-22: 10:00:00 via INTRAVENOUS
  Filled 2015-02-22: qty 1000

## 2015-02-22 MED ORDER — HEPARIN SOD (PORK) LOCK FLUSH 100 UNIT/ML IV SOLN
500.0000 [IU] | Freq: Once | INTRAVENOUS | Status: AC
Start: 2015-02-22 — End: 2015-02-22
  Administered 2015-02-22: 500 [IU] via INTRAVENOUS

## 2015-02-22 MED ORDER — DOXORUBICIN HCL CHEMO IV INJECTION 2 MG/ML
25.0000 mg/m2 | Freq: Once | INTRAVENOUS | Status: AC
  Administered 2015-02-22: 66 mg via INTRAVENOUS
  Filled 2015-02-22: qty 33

## 2015-02-22 MED ORDER — SODIUM CHLORIDE 0.9 % IJ SOLN
10.0000 mL | INTRAMUSCULAR | Status: DC | PRN
  Filled 2015-02-22: qty 10

## 2015-02-22 MED ORDER — VINBLASTINE SULFATE CHEMO INJECTION 1 MG/ML
6.0000 mg/m2 | Freq: Once | INTRAVENOUS | Status: DC
  Filled 2015-02-22: qty 16

## 2015-02-22 MED ORDER — SODIUM CHLORIDE 0.9 % IV SOLN
375.0000 mg/m2 | Freq: Once | INTRAVENOUS | Status: AC
  Administered 2015-02-22: 1000 mg via INTRAVENOUS
  Filled 2015-02-22: qty 50

## 2015-02-22 MED ORDER — BLEOMYCIN SULFATE CHEMO INJECTION 30 UNIT
10.0000 [IU]/m2 | Freq: Once | INTRAMUSCULAR | Status: AC
  Administered 2015-02-22: 27 [IU] via INTRAVENOUS
  Filled 2015-02-22: qty 9

## 2015-02-22 MED ORDER — PEGFILGRASTIM 6 MG/0.6ML ~~LOC~~ PSKT
6.0000 mg | PREFILLED_SYRINGE | Freq: Once | SUBCUTANEOUS | Status: AC
  Administered 2015-02-22: 6 mg via SUBCUTANEOUS

## 2015-02-22 MED ORDER — VINBLASTINE SULFATE CHEMO INJECTION 1 MG/ML
10.0000 mg | Freq: Once | INTRAVENOUS | Status: AC
  Administered 2015-02-22: 10 mg via INTRAVENOUS
  Filled 2015-02-22: qty 10

## 2015-02-22 NOTE — Telephone Encounter (Signed)
Discussed with MD. Vinblastine dose is 16mg . Due to an ordering issue, pharmacy only obtained a 10mg  vial. Dr. Grayland Ormond said that we could use what we had this one time.

## 2015-02-22 NOTE — Progress Notes (Signed)
Vinblastine not available at the time of administration (following Adriamycin), en route from [facility].  Spoke with Myra, pharmacist, who said can give bleomycin after Adriamycin and then Vinblastine.  LJ

## 2015-02-26 NOTE — Progress Notes (Signed)
[place]  [phone] [phone]  ID: Collier Flowers [name]. OB: 12-25-1990  MR#: [id number]  LPF#:[id number]  Patient Care Team: [name], MD as PCP - General (Family Medicine)  CHIEF COMPLAINT:  Chief Complaint  Patient presents with  . Follow-up    lymphoma    INTERVAL HISTORY: Patient returns to clinic today for further evaluation and consideration of cycle 4, day 15 of ABVD. He continues to tolerate his treatments well without significant side effects. He currently feels well and is asymptomatic. He has no neurologic complaints. He denies any fevers, chills, night sweats, or weight loss. He denies any difficulty swallowing or dysphagia. He has no chest pain, shortness of breath, or cough. He denies any nausea, vomiting, constipation, or diarrhea. He has no urinary complaints. Patient offers no specific complaints today.   REVIEW OF SYSTEMS:   Review of Systems  Constitutional: Negative.   HENT:       Denies dysphagia.  Respiratory: Negative.   Cardiovascular: Negative.     As per HPI. Otherwise, a complete review of systems is negatve.  PAST MEDICAL HISTORY: Past Medical History  Diagnosis Date  . Neck mass     right   . Cancer     Hodgkins Lymphoma    PAST SURGICAL HISTORY: Past Surgical History  Procedure Laterality Date  . Peripheral vascular catheterization N/A 11/09/2014    Procedure: Glori Luis Cath Insertion;  Surgeon: Algernon Huxley, MD;  Location: [facility];  Service: Cardiovascular;  Laterality: N/A;    FAMILY HISTORY Family History  Problem Relation Age of Onset  . Cancer Paternal Grandfather     head and neck cancer  . Diabetes Other   . Hypertension Other   . Cancer Other     history of prostate and kidney cancer       ADVANCED DIRECTIVES:    HEALTH MAINTENANCE: Social History  Substance Use Topics  . Smoking status: Never Smoker   . Smokeless tobacco: Never Used  . Alcohol Use: No       Comment: occasional     Colonoscopy:  PAP:  Bone density:  Lipid panel:  No Known Allergies  Current Outpatient Prescriptions  Medication Sig Dispense Refill  . acetaminophen (TYLENOL) 500 MG tablet Take 500 mg by mouth every 6 (six) hours as needed.    Marland Kitchen HYDROcodone-acetaminophen (NORCO/VICODIN) 5-325 MG per tablet Take 1 tablet by mouth every 6 (six) hours as needed for moderate pain. 30 tablet 0  . lidocaine-prilocaine (EMLA) cream Apply to port area at least 1 hour prior to being accessed 30 g 0  . prochlorperazine (COMPAZINE) 10 MG tablet Take 1 tablet (10 mg total) by mouth every 6 (six) hours as needed for nausea or vomiting. 30 tablet 2   No current facility-administered medications for this visit.   Facility-Administered Medications Ordered in Other Visits  Medication Dose Route Frequency Provider Last Rate Last Dose  . sodium chloride 0.9 % injection 10 mL  10 mL Intracatheter PRN Lloyd Huger, MD   10 mL at 01/11/15 0950    OBJECTIVE: Filed Vitals:   02/22/15 0928  BP: 117/76  Pulse: 84  Temp: 96.2 F (35.7 C)  Resp: 20     Body mass index is 41.95 kg/(m^2).    ECOG FS:0 - Asymptomatic  General: Well-developed, well-nourished, no acute distress. Eyes: anicteric sclera. HEENT: Right neck/supraclavicular mass is no longer palpable Lungs: Clear to auscultation bilaterally. Heart: Regular rate and rhythm.  No rubs, murmurs, or gallops. Abdomen: Soft, nontender, nondistended. No organomegaly noted, normoactive bowel sounds. Musculoskeletal: No edema, cyanosis, or clubbing. Neuro: Alert, answering all questions appropriately. Cranial nerves grossly intact. Skin: No rashes or petechiae noted. Psych: Normal affect.    LAB RESULTS:  Lab Results  Component Value Date   NA 138 02/22/2015   K 3.7 02/22/2015   CL 106 02/22/2015   CO2 26 02/22/2015   GLUCOSE 112* 02/22/2015   BUN 11 02/22/2015   CREATININE 1.19 02/22/2015   CALCIUM 8.9 02/22/2015    PROT 7.5 02/22/2015   ALBUMIN 4.1 02/22/2015   AST 26 02/22/2015   ALT 35 02/22/2015   ALKPHOS 90 02/22/2015   BILITOT 0.4 02/22/2015   GFRNONAA >60 02/22/2015   GFRAA >60 02/22/2015    Lab Results  Component Value Date   WBC 10.0 02/22/2015   NEUTROABS 7.9* 02/22/2015   HGB 12.5* 02/22/2015   HCT 37.2* 02/22/2015   MCV 77.9* 02/22/2015   PLT 251 02/22/2015     STUDIES: No results found.  ASSESSMENT: Stage II Hodgkin's lymphoma.  PLAN:    1. Hodgkin's lymphoma: PET scan results reviewed independently. Patient does not have any disease below his diaphragm. Bone marrow biopsy reported as negative for disease. MUGA scan and PFTs adequate to proceed with treatment. Proceed with cycle 4, day 15 of ABVD today. Patient will also receive OnPro Neulasta. Return to clinic in 2 weeks for consideration of cycle 5, day 1.  2. Leukopenia: Resolved. Secondary to chemotherapy, Neulasta as above. 3. Anemia: Mild, monitor.  Patient expressed understanding and was in agreement with this plan. He also understands that He can call clinic at any time with any questions, concerns, or complaints.    Lloyd Huger, MD   02/26/2015 10:41 AM

## 2015-03-08 ENCOUNTER — Inpatient Hospital Stay (HOSPITAL_BASED_OUTPATIENT_CLINIC_OR_DEPARTMENT_OTHER): Payer: Managed Care, Other (non HMO) | Admitting: Oncology

## 2015-03-08 ENCOUNTER — Inpatient Hospital Stay: Payer: Managed Care, Other (non HMO)

## 2015-03-08 VITALS — BP 124/74 | HR 102 | Temp 96.2°F | Resp 16 | Wt 317.2 lb

## 2015-03-08 DIAGNOSIS — D649 Anemia, unspecified: Secondary | ICD-10-CM | POA: Diagnosis not present

## 2015-03-08 DIAGNOSIS — Z418 Encounter for other procedures for purposes other than remedying health state: Secondary | ICD-10-CM

## 2015-03-08 DIAGNOSIS — Z79899 Other long term (current) drug therapy: Secondary | ICD-10-CM

## 2015-03-08 DIAGNOSIS — C8190 Hodgkin lymphoma, unspecified, unspecified site: Secondary | ICD-10-CM | POA: Diagnosis not present

## 2015-03-08 LAB — CBC WITH DIFFERENTIAL/PLATELET
BASOS ABS: 0 10*3/uL (ref 0–0.1)
Basophils Relative: 0 %
EOS ABS: 0.1 10*3/uL (ref 0–0.7)
Eosinophils Relative: 1 %
HCT: 35.6 % — ABNORMAL LOW (ref 40.0–52.0)
HEMOGLOBIN: 12 g/dL — AB (ref 13.0–18.0)
LYMPHS ABS: 0.7 10*3/uL — AB (ref 1.0–3.6)
Lymphocytes Relative: 10 %
MCH: 27.1 pg (ref 26.0–34.0)
MCHC: 33.8 g/dL (ref 32.0–36.0)
MCV: 80.1 fL (ref 80.0–100.0)
Monocytes Absolute: 0.7 10*3/uL (ref 0.2–1.0)
Monocytes Relative: 9 %
NEUTROS PCT: 80 %
Neutro Abs: 6.3 10*3/uL (ref 1.4–6.5)
Platelets: 244 10*3/uL (ref 150–440)
RBC: 4.44 MIL/uL (ref 4.40–5.90)
RDW: 24.3 % — ABNORMAL HIGH (ref 11.5–14.5)
WBC: 7.8 10*3/uL (ref 3.8–10.6)

## 2015-03-08 LAB — COMPREHENSIVE METABOLIC PANEL
ALBUMIN: 4 g/dL (ref 3.5–5.0)
ALK PHOS: 82 U/L (ref 38–126)
ALT: 29 U/L (ref 17–63)
AST: 32 U/L (ref 15–41)
Anion gap: 9 (ref 5–15)
BUN: 9 mg/dL (ref 6–20)
CALCIUM: 9.2 mg/dL (ref 8.9–10.3)
CO2: 24 mmol/L (ref 22–32)
CREATININE: 1.07 mg/dL (ref 0.61–1.24)
Chloride: 107 mmol/L (ref 101–111)
GFR calc non Af Amer: >60 mL/min (ref >60)
GLUCOSE: 129 mg/dL — AB (ref 65–99)
Potassium: 3.4 mmol/L — ABNORMAL LOW (ref 3.5–5.1)
SODIUM: 140 mmol/L (ref 135–145)
Total Bilirubin: 0.5 mg/dL (ref 0.3–1.2)
Total Protein: 7.4 g/dL (ref 6.5–8.1)

## 2015-03-08 MED ORDER — DOXORUBICIN HCL CHEMO IV INJECTION 2 MG/ML
25.0000 mg/m2 | Freq: Once | INTRAVENOUS | Status: AC
  Administered 2015-03-08: 66 mg via INTRAVENOUS
  Filled 2015-03-08: qty 33

## 2015-03-08 MED ORDER — SODIUM CHLORIDE 0.9 % IJ SOLN
10.0000 mL | INTRAMUSCULAR | Status: DC | PRN
  Administered 2015-03-08: 10 mL
  Filled 2015-03-08: qty 10

## 2015-03-08 MED ORDER — SODIUM CHLORIDE 0.9 % IV SOLN
6.0000 mg/m2 | Freq: Once | INTRAVENOUS | Status: AC
  Administered 2015-03-08: 16 mg via INTRAVENOUS
  Filled 2015-03-08: qty 16

## 2015-03-08 MED ORDER — SODIUM CHLORIDE 0.9 % IV SOLN
10.0000 [IU]/m2 | Freq: Once | INTRAVENOUS | Status: AC
  Administered 2015-03-08: 27 [IU] via INTRAVENOUS
  Filled 2015-03-08: qty 9

## 2015-03-08 MED ORDER — HEPARIN SOD (PORK) LOCK FLUSH 100 UNIT/ML IV SOLN
500.0000 [IU] | Freq: Once | INTRAVENOUS | Status: AC | PRN
  Administered 2015-03-08: 500 [IU]
  Filled 2015-03-08: qty 5

## 2015-03-08 MED ORDER — SODIUM CHLORIDE 0.9 % IV SOLN
Freq: Once | INTRAVENOUS | Status: AC
  Administered 2015-03-08: 10:00:00 via INTRAVENOUS
  Filled 2015-03-08: qty 1000

## 2015-03-08 MED ORDER — PEGFILGRASTIM 6 MG/0.6ML ~~LOC~~ PSKT
6.0000 mg | PREFILLED_SYRINGE | Freq: Once | SUBCUTANEOUS | Status: AC
  Administered 2015-03-08: 6 mg via SUBCUTANEOUS
  Filled 2015-03-08: qty 0.6

## 2015-03-08 MED ORDER — DACARBAZINE 200 MG IV SOLR
375.0000 mg/m2 | Freq: Once | INTRAVENOUS | Status: AC
  Administered 2015-03-08: 1000 mg via INTRAVENOUS
  Filled 2015-03-08: qty 50

## 2015-03-08 MED ORDER — SODIUM CHLORIDE 0.9 % IV SOLN
Freq: Once | INTRAVENOUS | Status: AC
  Administered 2015-03-08: 10:00:00 via INTRAVENOUS
  Filled 2015-03-08: qty 8

## 2015-03-17 NOTE — Progress Notes (Signed)
[place]  [phone] [phone]  ID: Nicholas [place]. OB: May 12, 1991  MR#: [id number]  UYQ#:[id number]  Patient Care Team: [name], MD as PCP - General (Family Medicine)  CHIEF COMPLAINT:  Chief Complaint  Patient presents with  . Lymphoma    INTERVAL HISTORY: Patient returns to clinic today for further evaluation and consideration of cycle 5, day 1 of ABVD. He continues to tolerate his treatments well without significant side effects. He currently feels well and is asymptomatic. He has no neurologic complaints. He denies any fevers, chills, night sweats, or weight loss. He denies any difficulty swallowing or dysphagia. He has no chest pain, shortness of breath, or cough. He denies any nausea, vomiting, constipation, or diarrhea. He has no urinary complaints. Patient offers no specific complaints today.   REVIEW OF SYSTEMS:   Review of Systems  Constitutional: Negative.   HENT:       Denies dysphagia.  Respiratory: Negative.   Cardiovascular: Negative.     As per HPI. Otherwise, a complete review of systems is negatve.  PAST MEDICAL HISTORY: Past Medical History  Diagnosis Date  . Neck mass     right   . Cancer     Hodgkins Lymphoma    PAST SURGICAL HISTORY: Past Surgical History  Procedure Laterality Date  . Peripheral vascular catheterization N/A 11/09/2014    Procedure: Glori Luis Cath Insertion;  Surgeon: Algernon Huxley, MD;  Location: [facility];  Service: Cardiovascular;  Laterality: N/A;    FAMILY HISTORY Family History  Problem Relation Age of Onset  . Cancer Paternal Grandfather     head and neck cancer  . Diabetes Other   . Hypertension Other   . Cancer Other     history of prostate and kidney cancer       ADVANCED DIRECTIVES:    HEALTH MAINTENANCE: Social History  Substance Use Topics  . Smoking status: Never Smoker   . Smokeless tobacco: Never Used  . Alcohol Use: No     Comment:  occasional     Colonoscopy:  PAP:  Bone density:  Lipid panel:  No Known Allergies  Current Outpatient Prescriptions  Medication Sig Dispense Refill  . acetaminophen (TYLENOL) 500 MG tablet Take 500 mg by mouth every 6 (six) hours as needed.    Marland Kitchen HYDROcodone-acetaminophen (NORCO/VICODIN) 5-325 MG per tablet Take 1 tablet by mouth every 6 (six) hours as needed for moderate pain. 30 tablet 0  . lidocaine-prilocaine (EMLA) cream Apply to port area at least 1 hour prior to being accessed 30 g 0  . prochlorperazine (COMPAZINE) 10 MG tablet Take 1 tablet (10 mg total) by mouth every 6 (six) hours as needed for nausea or vomiting. 30 tablet 2   No current facility-administered medications for this visit.   Facility-Administered Medications Ordered in Other Visits  Medication Dose Route Frequency Provider Last Rate Last Dose  . sodium chloride 0.9 % injection 10 mL  10 mL Intracatheter PRN Lloyd Huger, MD   10 mL at 01/11/15 0950    OBJECTIVE: Filed Vitals:   03/08/15 0945  BP: 124/74  Pulse: 102  Temp: 96.2 F (35.7 C)  Resp: 16     Body mass index is 41.86 kg/(m^2).    ECOG FS:0 - Asymptomatic  General: Well-developed, well-nourished, no acute distress. Eyes: anicteric sclera. HEENT: Right neck/supraclavicular mass is no longer palpable Lungs: Clear to auscultation bilaterally. Heart: Regular rate and rhythm. No rubs, murmurs, or gallops.  Abdomen: Soft, nontender, nondistended. No organomegaly noted, normoactive bowel sounds. Musculoskeletal: No edema, cyanosis, or clubbing. Neuro: Alert, answering all questions appropriately. Cranial nerves grossly intact. Skin: No rashes or petechiae noted. Psych: Normal affect.    LAB RESULTS:  Lab Results  Component Value Date   NA 140 03/08/2015   K 3.4* 03/08/2015   CL 107 03/08/2015   CO2 24 03/08/2015   GLUCOSE 129* 03/08/2015   BUN 9 03/08/2015   CREATININE 1.07 03/08/2015   CALCIUM 9.2 03/08/2015   PROT 7.4  03/08/2015   ALBUMIN 4.0 03/08/2015   AST 32 03/08/2015   ALT 29 03/08/2015   ALKPHOS 82 03/08/2015   BILITOT 0.5 03/08/2015   GFRNONAA >60 03/08/2015   GFRAA >60 03/08/2015    Lab Results  Component Value Date   WBC 7.8 03/08/2015   NEUTROABS 6.3 03/08/2015   HGB 12.0* 03/08/2015   HCT 35.6* 03/08/2015   MCV 80.1 03/08/2015   PLT 244 03/08/2015     STUDIES: No results found.  ASSESSMENT: Stage II Hodgkin's lymphoma.  PLAN:    1. Hodgkin's lymphoma: PET scan results reviewed independently. Patient does not have any disease below his diaphragm. Bone marrow biopsy reported as negative for disease. MUGA scan and PFTs adequate to proceed with treatment. Proceed with cycle 5, day 1 of ABVD today. Patient will also receive OnPro Neulasta. Return to clinic in 2 weeks for consideration of cycle 5, day 15 and then in 4 weeks for further evaluation and consideration of cycle 6, day 1.  2. Leukopenia: Resolved. Secondary to chemotherapy, Neulasta as above. 3. Anemia: Mild, monitor.  Patient expressed understanding and was in agreement with this plan. He also understands that He can call clinic at any time with any questions, concerns, or complaints.    Lloyd Huger, MD   03/17/2015 6:55 PM

## 2015-03-20 ENCOUNTER — Other Ambulatory Visit: Payer: Self-pay | Admitting: Oncology

## 2015-03-22 ENCOUNTER — Inpatient Hospital Stay: Payer: Managed Care, Other (non HMO)

## 2015-03-29 ENCOUNTER — Inpatient Hospital Stay: Payer: Managed Care, Other (non HMO)

## 2015-03-29 ENCOUNTER — Inpatient Hospital Stay: Payer: Managed Care, Other (non HMO) | Attending: Oncology

## 2015-03-29 VITALS — BP 127/79 | HR 78 | Temp 97.3°F | Resp 20

## 2015-03-29 DIAGNOSIS — C8190 Hodgkin lymphoma, unspecified, unspecified site: Secondary | ICD-10-CM

## 2015-03-29 DIAGNOSIS — D649 Anemia, unspecified: Secondary | ICD-10-CM | POA: Insufficient documentation

## 2015-03-29 DIAGNOSIS — Z418 Encounter for other procedures for purposes other than remedying health state: Secondary | ICD-10-CM | POA: Diagnosis not present

## 2015-03-29 DIAGNOSIS — Z5111 Encounter for antineoplastic chemotherapy: Secondary | ICD-10-CM | POA: Insufficient documentation

## 2015-03-29 DIAGNOSIS — Z79899 Other long term (current) drug therapy: Secondary | ICD-10-CM | POA: Insufficient documentation

## 2015-03-29 LAB — COMPREHENSIVE METABOLIC PANEL
ALBUMIN: 3.9 g/dL (ref 3.5–5.0)
ALK PHOS: 69 U/L (ref 38–126)
ALT: 34 U/L (ref 17–63)
AST: 69 U/L — AB (ref 15–41)
Anion gap: 6 (ref 5–15)
BUN: 10 mg/dL (ref 6–20)
CALCIUM: 8.8 mg/dL — AB (ref 8.9–10.3)
CHLORIDE: 105 mmol/L (ref 101–111)
CO2: 25 mmol/L (ref 22–32)
CREATININE: 1.06 mg/dL (ref 0.61–1.24)
GFR calc non Af Amer: >60 mL/min (ref >60)
GLUCOSE: 124 mg/dL — AB (ref 65–99)
Potassium: 3.5 mmol/L (ref 3.5–5.1)
SODIUM: 136 mmol/L (ref 135–145)
Total Bilirubin: 0.4 mg/dL (ref 0.3–1.2)
Total Protein: 7.4 g/dL (ref 6.5–8.1)

## 2015-03-29 LAB — CBC WITH DIFFERENTIAL/PLATELET
BASOS ABS: 0 10*3/uL (ref 0–0.1)
Basophils Relative: 1 %
EOS ABS: 0.1 10*3/uL (ref 0–0.7)
Eosinophils Relative: 2 %
HCT: 35.8 % — ABNORMAL LOW (ref 40.0–52.0)
HEMOGLOBIN: 12 g/dL — AB (ref 13.0–18.0)
LYMPHS ABS: 0.8 10*3/uL — AB (ref 1.0–3.6)
LYMPHS PCT: 17 %
MCH: 27.5 pg (ref 26.0–34.0)
MCHC: 33.6 g/dL (ref 32.0–36.0)
MCV: 81.8 fL (ref 80.0–100.0)
Monocytes Absolute: 0.4 10*3/uL (ref 0.2–1.0)
Monocytes Relative: 9 %
NEUTROS PCT: 71 %
Neutro Abs: 3.5 10*3/uL (ref 1.4–6.5)
Platelets: 255 10*3/uL (ref 150–440)
RBC: 4.38 MIL/uL — AB (ref 4.40–5.90)
RDW: 20.3 % — ABNORMAL HIGH (ref 11.5–14.5)
WBC: 4.9 10*3/uL (ref 3.8–10.6)

## 2015-03-29 MED ORDER — SODIUM CHLORIDE 0.9 % IJ SOLN
10.0000 mL | INTRAMUSCULAR | Status: DC | PRN
  Administered 2015-03-29: 10 mL
  Filled 2015-03-29: qty 10

## 2015-03-29 MED ORDER — SODIUM CHLORIDE 0.9 % IV SOLN
Freq: Once | INTRAVENOUS | Status: AC
  Administered 2015-03-29: 13:00:00 via INTRAVENOUS
  Filled 2015-03-29: qty 1000

## 2015-03-29 MED ORDER — DOXORUBICIN HCL CHEMO IV INJECTION 2 MG/ML
25.0000 mg/m2 | Freq: Once | INTRAVENOUS | Status: AC
  Administered 2015-03-29: 66 mg via INTRAVENOUS
  Filled 2015-03-29: qty 33

## 2015-03-29 MED ORDER — VINBLASTINE SULFATE CHEMO INJECTION 1 MG/ML
6.0000 mg/m2 | Freq: Once | INTRAVENOUS | Status: AC
  Administered 2015-03-29: 16 mg via INTRAVENOUS
  Filled 2015-03-29: qty 16

## 2015-03-29 MED ORDER — SODIUM CHLORIDE 0.9 % IV SOLN
375.0000 mg/m2 | Freq: Once | INTRAVENOUS | Status: AC
  Administered 2015-03-29: 1000 mg via INTRAVENOUS
  Filled 2015-03-29: qty 50

## 2015-03-29 MED ORDER — SODIUM CHLORIDE 0.9 % IV SOLN
Freq: Once | INTRAVENOUS | Status: AC
  Administered 2015-03-29: 13:00:00 via INTRAVENOUS
  Filled 2015-03-29: qty 8

## 2015-03-29 MED ORDER — SODIUM CHLORIDE 0.9 % IV SOLN
10.0000 [IU]/m2 | Freq: Once | INTRAVENOUS | Status: AC
  Administered 2015-03-29: 27 [IU] via INTRAVENOUS
  Filled 2015-03-29: qty 9

## 2015-03-29 MED ORDER — HEPARIN SOD (PORK) LOCK FLUSH 100 UNIT/ML IV SOLN
500.0000 [IU] | Freq: Once | INTRAVENOUS | Status: DC | PRN
  Filled 2015-03-29: qty 5

## 2015-03-29 MED ORDER — PEGFILGRASTIM 6 MG/0.6ML ~~LOC~~ PSKT
6.0000 mg | PREFILLED_SYRINGE | Freq: Once | SUBCUTANEOUS | Status: AC
  Administered 2015-03-29: 6 mg via SUBCUTANEOUS
  Filled 2015-03-29: qty 0.6

## 2015-04-05 ENCOUNTER — Inpatient Hospital Stay: Payer: Managed Care, Other (non HMO)

## 2015-04-05 ENCOUNTER — Encounter: Payer: Self-pay | Admitting: Oncology

## 2015-04-05 ENCOUNTER — Inpatient Hospital Stay (HOSPITAL_BASED_OUTPATIENT_CLINIC_OR_DEPARTMENT_OTHER): Payer: Managed Care, Other (non HMO) | Admitting: Oncology

## 2015-04-05 VITALS — BP 134/83 | HR 88 | Temp 96.0°F | Resp 18 | Wt 319.9 lb

## 2015-04-05 DIAGNOSIS — C8101 Nodular lymphocyte predominant Hodgkin lymphoma, lymph nodes of head, face, and neck: Secondary | ICD-10-CM

## 2015-04-05 DIAGNOSIS — C8190 Hodgkin lymphoma, unspecified, unspecified site: Secondary | ICD-10-CM

## 2015-04-05 DIAGNOSIS — D649 Anemia, unspecified: Secondary | ICD-10-CM

## 2015-04-05 DIAGNOSIS — Z79899 Other long term (current) drug therapy: Secondary | ICD-10-CM | POA: Diagnosis not present

## 2015-04-05 DIAGNOSIS — Z418 Encounter for other procedures for purposes other than remedying health state: Secondary | ICD-10-CM | POA: Diagnosis not present

## 2015-04-05 LAB — COMPREHENSIVE METABOLIC PANEL WITH GFR
ALT: 27 U/L (ref 17–63)
AST: 22 U/L (ref 15–41)
Albumin: 4 g/dL (ref 3.5–5.0)
Alkaline Phosphatase: 94 U/L (ref 38–126)
Anion gap: 9 (ref 5–15)
BUN: 10 mg/dL (ref 6–20)
CO2: 25 mmol/L (ref 22–32)
Calcium: 8.8 mg/dL — ABNORMAL LOW (ref 8.9–10.3)
Chloride: 102 mmol/L (ref 101–111)
Creatinine, Ser: 0.97 mg/dL (ref 0.61–1.24)
GFR calc Af Amer: >60 mL/min
GFR calc non Af Amer: >60 mL/min
Glucose, Bld: 134 mg/dL — ABNORMAL HIGH (ref 65–99)
Potassium: 3.2 mmol/L — ABNORMAL LOW (ref 3.5–5.1)
Sodium: 136 mmol/L (ref 135–145)
Total Bilirubin: 0.5 mg/dL (ref 0.3–1.2)
Total Protein: 7.2 g/dL (ref 6.5–8.1)

## 2015-04-05 LAB — CBC WITH DIFFERENTIAL/PLATELET
Basophils Absolute: 0.1 10*3/uL (ref 0–0.1)
Basophils Relative: 1 %
Eosinophils Absolute: 0.1 10*3/uL (ref 0–0.7)
Eosinophils Relative: 1 %
HCT: 36.3 % — ABNORMAL LOW (ref 40.0–52.0)
Hemoglobin: 12.2 g/dL — ABNORMAL LOW (ref 13.0–18.0)
Lymphocytes Relative: 10 %
Lymphs Abs: 1.3 10*3/uL (ref 1.0–3.6)
MCH: 27.6 pg (ref 26.0–34.0)
MCHC: 33.5 g/dL (ref 32.0–36.0)
MCV: 82.3 fL (ref 80.0–100.0)
Monocytes Absolute: 0.6 10*3/uL (ref 0.2–1.0)
Monocytes Relative: 5 %
Neutro Abs: 11.2 10*3/uL — ABNORMAL HIGH (ref 1.4–6.5)
Neutrophils Relative %: 83 %
Platelets: 213 10*3/uL (ref 150–440)
RBC: 4.41 MIL/uL (ref 4.40–5.90)
RDW: 18.5 % — ABNORMAL HIGH (ref 11.5–14.5)
WBC: 13.3 10*3/uL — ABNORMAL HIGH (ref 3.8–10.6)

## 2015-04-05 MED ORDER — PEGFILGRASTIM 6 MG/0.6ML ~~LOC~~ PSKT: 6.0000 mg | PREFILLED_SYRINGE | Freq: Once | SUBCUTANEOUS | Status: DC

## 2015-04-05 MED ORDER — SODIUM CHLORIDE 0.9 % IV SOLN
Freq: Once | INTRAVENOUS | Status: AC
  Administered 2015-04-05: 11:00:00 via INTRAVENOUS
  Filled 2015-04-05: qty 1000

## 2015-04-05 MED ORDER — HEPARIN SOD (PORK) LOCK FLUSH 100 UNIT/ML IV SOLN
INTRAVENOUS | Status: AC
  Filled 2015-04-05: qty 5

## 2015-04-05 MED ORDER — HEPARIN SOD (PORK) LOCK FLUSH 100 UNIT/ML IV SOLN
500.0000 [IU] | Freq: Once | INTRAVENOUS | Status: AC
  Administered 2015-04-05: 500 [IU] via INTRAVENOUS

## 2015-04-05 MED ORDER — SODIUM CHLORIDE 0.9 % IV SOLN
10.0000 [IU]/m2 | Freq: Once | INTRAVENOUS | Status: AC
  Administered 2015-04-05: 27 [IU] via INTRAVENOUS
  Filled 2015-04-05: qty 9

## 2015-04-05 MED ORDER — SODIUM CHLORIDE 0.9 % IV SOLN
Freq: Once | INTRAVENOUS | Status: AC
  Administered 2015-04-05: 11:00:00 via INTRAVENOUS
  Filled 2015-04-05: qty 8

## 2015-04-05 MED ORDER — VINBLASTINE SULFATE CHEMO INJECTION 1 MG/ML
6.0000 mg/m2 | Freq: Once | INTRAVENOUS | Status: AC
  Administered 2015-04-05: 16 mg via INTRAVENOUS
  Filled 2015-04-05: qty 16

## 2015-04-05 MED ORDER — DOXORUBICIN HCL CHEMO IV INJECTION 2 MG/ML
25.0000 mg/m2 | Freq: Once | INTRAVENOUS | Status: AC
  Administered 2015-04-05: 66 mg via INTRAVENOUS
  Filled 2015-04-05: qty 33

## 2015-04-05 MED ORDER — SODIUM CHLORIDE 0.9 % IV SOLN
375.0000 mg/m2 | Freq: Once | INTRAVENOUS | Status: DC
  Filled 2015-04-05: qty 50

## 2015-04-05 NOTE — Progress Notes (Unsigned)
Patient did not receive Neulasta On Body or Dacarbazine today - Dacarbazine was not available through pharmacy.  Patient will return tomorrow for remainder of treatment.  LJ

## 2015-04-06 ENCOUNTER — Inpatient Hospital Stay: Payer: Managed Care, Other (non HMO)

## 2015-04-06 VITALS — BP 136/82 | HR 86 | Temp 96.1°F | Resp 18

## 2015-04-06 DIAGNOSIS — C8100 Nodular lymphocyte predominant Hodgkin lymphoma, unspecified site: Secondary | ICD-10-CM

## 2015-04-06 DIAGNOSIS — C8190 Hodgkin lymphoma, unspecified, unspecified site: Secondary | ICD-10-CM | POA: Diagnosis not present

## 2015-04-06 MED ORDER — HEPARIN SOD (PORK) LOCK FLUSH 100 UNIT/ML IV SOLN
INTRAVENOUS | Status: AC
  Filled 2015-04-06: qty 5

## 2015-04-06 MED ORDER — SODIUM CHLORIDE 0.9 % IJ SOLN
10.0000 mL | INTRAMUSCULAR | Status: DC | PRN
Start: 2015-04-06 — End: 2015-04-06
  Filled 2015-04-06: qty 10

## 2015-04-06 MED ORDER — SODIUM CHLORIDE 0.9 % IV SOLN
INTRAVENOUS | Status: DC
  Administered 2015-04-06: 09:00:00 via INTRAVENOUS
  Filled 2015-04-06: qty 1000

## 2015-04-06 MED ORDER — SODIUM CHLORIDE 0.9 % IV SOLN
1000.0000 mg | Freq: Once | INTRAVENOUS | Status: AC
  Administered 2015-04-06: 1000 mg via INTRAVENOUS
  Filled 2015-04-06: qty 50

## 2015-04-06 MED ORDER — PEGFILGRASTIM 6 MG/0.6ML ~~LOC~~ PSKT
6.0000 mg | PREFILLED_SYRINGE | Freq: Once | SUBCUTANEOUS | Status: AC
  Administered 2015-04-06: 6 mg via SUBCUTANEOUS
  Filled 2015-04-06: qty 0.6

## 2015-04-06 MED ORDER — HEPARIN SOD (PORK) LOCK FLUSH 100 UNIT/ML IV SOLN
500.0000 [IU] | Freq: Once | INTRAVENOUS | Status: AC
  Administered 2015-04-06: 500 [IU] via INTRAVENOUS

## 2015-04-19 ENCOUNTER — Inpatient Hospital Stay: Payer: Managed Care, Other (non HMO)

## 2015-04-19 DIAGNOSIS — C8190 Hodgkin lymphoma, unspecified, unspecified site: Secondary | ICD-10-CM

## 2015-04-19 LAB — COMPREHENSIVE METABOLIC PANEL
ALBUMIN: 4.1 g/dL (ref 3.5–5.0)
ALT: 38 U/L (ref 17–63)
AST: 30 U/L (ref 15–41)
Alkaline Phosphatase: 101 U/L (ref 38–126)
Anion gap: 8 (ref 5–15)
BUN: 12 mg/dL (ref 6–20)
CHLORIDE: 105 mmol/L (ref 101–111)
CO2: 24 mmol/L (ref 22–32)
CREATININE: 1.03 mg/dL (ref 0.61–1.24)
Calcium: 8.9 mg/dL (ref 8.9–10.3)
GFR calc Af Amer: >60 mL/min (ref >60)
GLUCOSE: 127 mg/dL — AB (ref 65–99)
POTASSIUM: 3.6 mmol/L (ref 3.5–5.1)
Sodium: 137 mmol/L (ref 135–145)
Total Bilirubin: 0.4 mg/dL (ref 0.3–1.2)
Total Protein: 7.3 g/dL (ref 6.5–8.1)

## 2015-04-19 LAB — CBC WITH DIFFERENTIAL/PLATELET
BASOS ABS: 0.1 10*3/uL (ref 0–0.1)
BASOS PCT: 0 %
EOS PCT: 1 %
Eosinophils Absolute: 0.1 10*3/uL (ref 0–0.7)
HEMATOCRIT: 37 % — AB (ref 40.0–52.0)
Hemoglobin: 12.4 g/dL — ABNORMAL LOW (ref 13.0–18.0)
LYMPHS PCT: 7 %
Lymphs Abs: 1.6 10*3/uL (ref 1.0–3.6)
MCH: 27.9 pg (ref 26.0–34.0)
MCHC: 33.4 g/dL (ref 32.0–36.0)
MCV: 83.7 fL (ref 80.0–100.0)
Monocytes Absolute: 1.1 10*3/uL — ABNORMAL HIGH (ref 0.2–1.0)
Monocytes Relative: 5 %
NEUTROS ABS: 20.4 10*3/uL — AB (ref 1.4–6.5)
Neutrophils Relative %: 87 %
PLATELETS: 227 10*3/uL (ref 150–440)
RBC: 4.42 MIL/uL (ref 4.40–5.90)
RDW: 18 % — AB (ref 11.5–14.5)
WBC: 23.2 10*3/uL — AB (ref 3.8–10.6)

## 2015-04-19 MED ORDER — HEPARIN SOD (PORK) LOCK FLUSH 100 UNIT/ML IV SOLN
500.0000 [IU] | Freq: Once | INTRAVENOUS | Status: AC | PRN
  Administered 2015-04-19: 500 [IU]
  Filled 2015-04-19: qty 5

## 2015-04-19 MED ORDER — PEGFILGRASTIM 6 MG/0.6ML ~~LOC~~ PSKT
6.0000 mg | PREFILLED_SYRINGE | Freq: Once | SUBCUTANEOUS | Status: AC
  Administered 2015-04-19: 6 mg via SUBCUTANEOUS
  Filled 2015-04-19: qty 0.6

## 2015-04-19 MED ORDER — SODIUM CHLORIDE 0.9 % IV SOLN
375.0000 mg/m2 | Freq: Once | INTRAVENOUS | Status: AC
  Administered 2015-04-19: 1000 mg via INTRAVENOUS
  Filled 2015-04-19: qty 50

## 2015-04-19 MED ORDER — SODIUM CHLORIDE 0.9 % IV SOLN
10.0000 [IU]/m2 | Freq: Once | INTRAVENOUS | Status: AC
  Administered 2015-04-19: 27 [IU] via INTRAVENOUS
  Filled 2015-04-19: qty 9

## 2015-04-19 MED ORDER — SODIUM CHLORIDE 0.9 % IV SOLN
6.0000 mg/m2 | Freq: Once | INTRAVENOUS | Status: AC
  Administered 2015-04-19: 16 mg via INTRAVENOUS
  Filled 2015-04-19: qty 16

## 2015-04-19 MED ORDER — SODIUM CHLORIDE 0.9 % IV SOLN
Freq: Once | INTRAVENOUS | Status: AC
  Administered 2015-04-19: 10:00:00 via INTRAVENOUS
  Filled 2015-04-19: qty 8

## 2015-04-19 MED ORDER — SODIUM CHLORIDE 0.9 % IV SOLN
Freq: Once | INTRAVENOUS | Status: AC
  Administered 2015-04-19: 10:00:00 via INTRAVENOUS
  Filled 2015-04-19: qty 1000

## 2015-04-19 MED ORDER — DOXORUBICIN HCL CHEMO IV INJECTION 2 MG/ML
25.0000 mg/m2 | Freq: Once | INTRAVENOUS | Status: AC
  Administered 2015-04-19: 66 mg via INTRAVENOUS
  Filled 2015-04-19: qty 33

## 2015-04-19 NOTE — Progress Notes (Signed)
[facility]  [phone] [phone]  ID: Nicholas [place]. OB: [birth year]  MR#: [id number]  YYH#:[id number]  Patient Care Team: [name], MD as PCP - General (Family Medicine)  CHIEF COMPLAINT:  Chief Complaint  Patient presents with  . Lymphoma    INTERVAL HISTORY: Patient returns to clinic today for further evaluation and consideration of cycle 6, day 1 of ABVD. He continues to tolerate his treatments well without significant side effects. He currently feels well and is asymptomatic. He has no neurologic complaints. He denies any fevers, chills, night sweats, or weight loss. He denies any difficulty swallowing or dysphagia. He has no chest pain, shortness of breath, or cough. He denies any nausea, vomiting, constipation, or diarrhea. He has no urinary complaints. Patient offers no specific complaints today.   REVIEW OF SYSTEMS:   Review of Systems  Constitutional: Negative.   HENT:       Denies dysphagia.  Respiratory: Negative.   Cardiovascular: Negative.     As per HPI. Otherwise, a complete review of systems is negatve.  PAST MEDICAL HISTORY: Past Medical History  Diagnosis Date  . Neck mass     right   . Cancer ([place])     Hodgkins Lymphoma    PAST SURGICAL HISTORY: Past Surgical History  Procedure Laterality Date  . Peripheral vascular catheterization N/A 11/09/2014    Procedure: Glori Luis Cath Insertion;  Surgeon: Algernon Huxley, MD;  Location: [facility];  Service: Cardiovascular;  Laterality: N/A;    FAMILY HISTORY Family History  Problem Relation Age of Onset  . Cancer Paternal Grandfather     head and neck cancer  . Diabetes Other   . Hypertension Other   . Cancer Other     history of prostate and kidney cancer       ADVANCED DIRECTIVES:    HEALTH MAINTENANCE: Social History  Substance Use Topics  . Smoking status: Never Smoker   . Smokeless tobacco: Never Used  . Alcohol Use: No   Comment: occasional     Colonoscopy:  PAP:  Bone density:  Lipid panel:  No Known Allergies  Current Outpatient Prescriptions  Medication Sig Dispense Refill  . acetaminophen (TYLENOL) 500 MG tablet Take 500 mg by mouth every 6 (six) hours as needed.    Marland Kitchen HYDROcodone-acetaminophen (NORCO/VICODIN) 5-325 MG per tablet Take 1 tablet by mouth every 6 (six) hours as needed for moderate pain. 30 tablet 0  . lidocaine-prilocaine (EMLA) cream Apply to port area at least 1 hour prior to being accessed 30 g 0  . prochlorperazine (COMPAZINE) 10 MG tablet Take 1 tablet (10 mg total) by mouth every 6 (six) hours as needed for nausea or vomiting. 30 tablet 2   No current facility-administered medications for this visit.   Facility-Administered Medications Ordered in Other Visits  Medication Dose Route Frequency Provider Last Rate Last Dose  . dacarbazine (DTIC) 1,000 mg in sodium chloride 0.9 % 250 mL chemo infusion  375 mg/m2 (Treatment Plan Actual) Intravenous Once Lloyd Huger, MD      . heparin lock flush 100 unit/mL  500 Units Intracatheter Once PRN Lloyd Huger, MD      . pegfilgrastim (NEULASTA ONPRO KIT) injection 6 mg  6 mg Subcutaneous Once Lloyd Huger, MD      . sodium chloride 0.9 % injection 10 mL  10 mL Intracatheter PRN Lloyd Huger, MD   10 mL at 01/11/15 [id number]  OBJECTIVE: Filed Vitals:   04/05/15 0858  BP: 134/83  Pulse: 88  Temp: 96 F (35.6 C)  Resp: 18     Body mass index is 42.21 kg/(m^2).    ECOG FS:0 - Asymptomatic  General: Well-developed, well-nourished, no acute distress. Eyes: anicteric sclera. HEENT: Right neck/supraclavicular mass is no longer palpable Lungs: Clear to auscultation bilaterally. Heart: Regular rate and rhythm. No rubs, murmurs, or gallops. Abdomen: Soft, nontender, nondistended. No organomegaly noted, normoactive bowel sounds. Musculoskeletal: No edema, cyanosis, or clubbing. Neuro: Alert, answering all questions  appropriately. Cranial nerves grossly intact. Skin: No rashes or petechiae noted. Psych: Normal affect.    LAB RESULTS:  Lab Results  Component Value Date   NA 137 04/19/2015   K 3.6 04/19/2015   CL 105 04/19/2015   CO2 24 04/19/2015   GLUCOSE 127* 04/19/2015   BUN 12 04/19/2015   CREATININE 1.03 04/19/2015   CALCIUM 8.9 04/19/2015   PROT 7.3 04/19/2015   ALBUMIN 4.1 04/19/2015   AST 30 04/19/2015   ALT 38 04/19/2015   ALKPHOS 101 04/19/2015   BILITOT 0.4 04/19/2015   GFRNONAA >60 04/19/2015   GFRAA >60 04/19/2015    Lab Results  Component Value Date   WBC 23.2* 04/19/2015   NEUTROABS 20.4* 04/19/2015   HGB 12.4* 04/19/2015   HCT 37.0* 04/19/2015   MCV 83.7 04/19/2015   PLT 227 04/19/2015     STUDIES: No results found.  ASSESSMENT: Stage II Hodgkin's lymphoma.  PLAN:    1. Hodgkin's lymphoma: PET scan results reviewed independently. Patient does not have any disease below his diaphragm. Bone marrow biopsy reported as negative for disease. MUGA scan and PFTs adequate to proceed with treatment. Proceed with cycle 6, day 1 of ABVD today. Patient will also receive OnPro Neulasta. Return to clinic in 2 weeks for consideration of cycle 6, day 15. This will be patient's last treatment. Patient will have PET scan approximately one month after conclusion of his therapy for restaging purposes. Follow-up 1-2 days later.  2. Leukopenia: Resolved. Secondary to chemotherapy, Neulasta as above. 3. Anemia: Mild, monitor.  Patient expressed understanding and was in agreement with this plan. He also understands that He can call clinic at any time with any questions, concerns, or complaints.    Lloyd Huger, MD   04/19/2015 11:22 AM

## 2015-05-21 ENCOUNTER — Ambulatory Visit
Admission: RE | Admit: 2015-05-21 | Discharge: 2015-05-21 | Disposition: A | Discharge status: Home / Self Care | Payer: Managed Care, Other (non HMO) | Source: Ambulatory Visit | Attending: Oncology | Admitting: Oncology

## 2015-05-21 DIAGNOSIS — C8190 Hodgkin lymphoma, unspecified, unspecified site: Secondary | ICD-10-CM | POA: Insufficient documentation

## 2015-05-21 DIAGNOSIS — C8101 Nodular lymphocyte predominant Hodgkin lymphoma, lymph nodes of head, face, and neck: Secondary | ICD-10-CM

## 2015-05-21 DIAGNOSIS — R590 Localized enlarged lymph nodes: Secondary | ICD-10-CM | POA: Diagnosis not present

## 2015-05-21 LAB — GLUCOSE, CAPILLARY: GLUCOSE-CAPILLARY: 82 mg/dL (ref 65–99)

## 2015-05-21 MED ORDER — FLUDEOXYGLUCOSE F - 18 (FDG) INJECTION
12.5000 | Freq: Once | INTRAVENOUS | Status: AC | PRN
  Administered 2015-05-21: 12.5 via INTRAVENOUS

## 2015-05-23 ENCOUNTER — Ambulatory Visit: Payer: Managed Care, Other (non HMO) | Admitting: Oncology

## 2015-05-24 ENCOUNTER — Inpatient Hospital Stay: Payer: Managed Care, Other (non HMO)

## 2015-05-24 ENCOUNTER — Inpatient Hospital Stay: Payer: Managed Care, Other (non HMO) | Attending: Oncology | Admitting: Oncology

## 2015-05-24 VITALS — BP 114/73 | HR 67 | Temp 97.2°F | Resp 16 | Wt 325.2 lb

## 2015-05-24 DIAGNOSIS — C8190 Hodgkin lymphoma, unspecified, unspecified site: Secondary | ICD-10-CM | POA: Diagnosis not present

## 2015-05-24 DIAGNOSIS — D649 Anemia, unspecified: Secondary | ICD-10-CM | POA: Insufficient documentation

## 2015-05-24 DIAGNOSIS — Z79899 Other long term (current) drug therapy: Secondary | ICD-10-CM | POA: Diagnosis not present

## 2015-05-24 DIAGNOSIS — Z452 Encounter for adjustment and management of vascular access device: Secondary | ICD-10-CM | POA: Insufficient documentation

## 2015-05-24 DIAGNOSIS — C801 Malignant (primary) neoplasm, unspecified: Secondary | ICD-10-CM

## 2015-05-24 DIAGNOSIS — C8198 Hodgkin lymphoma, unspecified, lymph nodes of multiple sites: Secondary | ICD-10-CM

## 2015-05-24 MED ORDER — SODIUM CHLORIDE 0.9 % IJ SOLN
10.0000 mL | INTRAMUSCULAR | Status: DC | PRN
  Filled 2015-05-24: qty 10

## 2015-05-24 MED ORDER — HEPARIN SOD (PORK) LOCK FLUSH 100 UNIT/ML IV SOLN
INTRAVENOUS | Status: AC
  Filled 2015-05-24: qty 5

## 2015-05-24 MED ORDER — HEPARIN SOD (PORK) LOCK FLUSH 10 UNIT/ML IV SOLN: 10.0000 [IU] | Freq: Once | INTRAVENOUS | Status: DC

## 2015-05-24 NOTE — Progress Notes (Signed)
Patient denies any problems or concerns today.

## 2015-06-04 ENCOUNTER — Ambulatory Visit: Payer: Managed Care, Other (non HMO)

## 2015-06-05 NOTE — Progress Notes (Signed)
[facility]  Telephone:([phone] [phone]  ID: Collier Flowers [name]. OB: [birth year]  MR#: [id number]  [id number]  Patient Care Team: [name], MD as PCP - General (Family Medicine)  CHIEF COMPLAINT:  Chief Complaint  Patient presents with  . Lymphoma    discuss results    INTERVAL HISTORY: Patient returns to clinic today for further evaluation and discussion of his imaging results. He currently feels well and is asymptomatic. He has no neurologic complaints. He denies any fevers, chills, night sweats, or weight loss. He denies any difficulty swallowing or dysphagia. He has no chest pain, shortness of breath, or cough. He denies any nausea, vomiting, constipation, or diarrhea. He has no urinary complaints. Patient offers no specific complaints today.   REVIEW OF SYSTEMS:   Review of Systems  Constitutional: Negative.   HENT:       Denies dysphagia.  Respiratory: Negative.   Cardiovascular: Negative.     As per HPI. Otherwise, a complete review of systems is negatve.  PAST MEDICAL HISTORY: Past Medical History  Diagnosis Date  . Neck mass     right   . Cancer ([place])     Hodgkins Lymphoma    PAST SURGICAL HISTORY: Past Surgical History  Procedure Laterality Date  . Peripheral vascular catheterization N/A 11/09/2014    Procedure: Glori Luis Cath Insertion;  Surgeon: Algernon Huxley, MD;  Location: [facility];  Service: Cardiovascular;  Laterality: N/A;    FAMILY HISTORY Family History  Problem Relation Age of Onset  . Cancer Paternal Grandfather     head and neck cancer  . Diabetes Other   . Hypertension Other   . Cancer Other     history of prostate and kidney cancer       ADVANCED DIRECTIVES:    HEALTH MAINTENANCE: Social History  Substance Use Topics  . Smoking status: Never Smoker   . Smokeless tobacco: Never Used  . Alcohol Use: No     Comment: occasional     Colonoscopy:  PAP:  Bone  density:  Lipid panel:  No Known Allergies  Current Outpatient Prescriptions  Medication Sig Dispense Refill  . acetaminophen (TYLENOL) 500 MG tablet Take 500 mg by mouth every 6 (six) hours as needed.    Marland Kitchen HYDROcodone-acetaminophen (NORCO/VICODIN) 5-325 MG per tablet Take 1 tablet by mouth every 6 (six) hours as needed for moderate pain. (Patient not taking: Reported on 05/24/2015) 30 tablet 0  . lidocaine-prilocaine (EMLA) cream Apply to port area at least 1 hour prior to being accessed (Patient not taking: Reported on 05/24/2015) 30 g 0  . prochlorperazine (COMPAZINE) 10 MG tablet Take 1 tablet (10 mg total) by mouth every 6 (six) hours as needed for nausea or vomiting. (Patient not taking: Reported on 05/24/2015) 30 tablet 2   No current facility-administered medications for this visit.   Facility-Administered Medications Ordered in Other Visits  Medication Dose Route Frequency Provider Last Rate Last Dose  . sodium chloride 0.9 % injection 10 mL  10 mL Intracatheter PRN Lloyd Huger, MD   10 mL at 01/11/15 0950    OBJECTIVE: Filed Vitals:   05/24/15 1106  BP: 114/73  Pulse: 67  Temp: 97.2 F (36.2 C)  Resp: 16     Body mass index is 42.91 kg/(m^2).    ECOG FS:0 - Asymptomatic  General: Well-developed, well-nourished, no acute distress. Eyes: anicteric sclera. HEENT: Right neck/supraclavicular mass is no longer palpable Lungs: Clear to auscultation bilaterally.  Heart: Regular rate and rhythm. No rubs, murmurs, or gallops. Abdomen: Soft, nontender, nondistended. No organomegaly noted, normoactive bowel sounds. Musculoskeletal: No edema, cyanosis, or clubbing. Neuro: Alert, answering all questions appropriately. Cranial nerves grossly intact. Skin: No rashes or petechiae noted. Psych: Normal affect.    LAB RESULTS:  Lab Results  Component Value Date   NA 137 04/19/2015   K 3.6 04/19/2015   CL 105 04/19/2015   CO2 24 04/19/2015   GLUCOSE 127* 04/19/2015   BUN 12  04/19/2015   CREATININE 1.03 04/19/2015   CALCIUM 8.9 04/19/2015   PROT 7.3 04/19/2015   ALBUMIN 4.1 04/19/2015   AST 30 04/19/2015   ALT 38 04/19/2015   ALKPHOS 101 04/19/2015   BILITOT 0.4 04/19/2015   GFRNONAA >60 04/19/2015   GFRAA >60 04/19/2015    Lab Results  Component Value Date   WBC 23.2* 04/19/2015   NEUTROABS 20.4* 04/19/2015   HGB 12.4* 04/19/2015   HCT 37.0* 04/19/2015   MCV 83.7 04/19/2015   PLT 227 04/19/2015     STUDIES: Nm Pet Image Restag (ps) Skull Base To Thigh  05/21/2015  CLINICAL DATA:  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 EXAM: NUCLEAR MEDICINE PET SKULL BASE TO THIGH TECHNIQUE: 12.5 mCi F-18 FDG was injected intravenously. Full-ring PET imaging was performed from the skull base to thigh after the radiotracer. CT data was obtained and used for attenuation correction and anatomic localization. FASTING BLOOD GLUCOSE:  Value: 82 mg/dl COMPARISON:  10/16/2014 FINDINGS: NECK No hypermetabolic lymph nodes or other masses seen in the neck on today's exam. Previous seen hypermetabolic right supraclavicular mass and other lower cervical lymphadenopathy has resolved since previous study. Hypermetabolic activity is seen within brown fat within neck bilaterally. CHEST No hypermetabolic mediastinal or hilar nodes. Right paratracheal lymphadenopathy which no longer shows metabolic activity has decreased, currently measuring 1.8 cm on image 78/series 3 compared to 2.9 cm previously. Mild metabolic activity noted within brown fat within the upper thorax. No suspicious pulmonary nodules on the CT scan. ABDOMEN/PELVIS No abnormal hypermetabolic activity within the liver, pancreas, adrenal glands, or spleen. No hypermetabolic lymph nodes in the abdomen or pelvis. SKELETON No focal hypermetabolic activity to suggest skeletal metastasis. IMPRESSION: Complete metabolic response to therapy. Mild residual right paratracheal mediastinal  lymphadenopathy shows absence of metabolic activity on today's exam. Electronically Signed   By: Earle Gell M.D.   On: 05/21/2015 13:42    ASSESSMENT: Stage II Hodgkin's lymphoma.  PLAN:    1. Hodgkin's lymphoma: PET scan results from May 21, 2015 reviewed independently and reported as above with no evidence of disease. No further intervention is needed at this time. Patient will return to clinic in January for her survivorship visit and then in 3 months for repeat laboratory work and further evaluation. Plan to do imaging with CT scan every 6 months for the next 2 years and then yearly thereafter. 2. Leukopenia: Resolved.  3. Anemia: Mild, monitor.  Patient expressed understanding and was in agreement with this plan. He also understands that He can call clinic at any time with any questions, concerns, or complaints.    Lloyd Huger, MD   06/05/2015 12:12 PM

## 2015-06-07 ENCOUNTER — Ambulatory Visit: Payer: Managed Care, Other (non HMO)

## 2015-06-14 ENCOUNTER — Ambulatory Visit: Payer: Managed Care, Other (non HMO)

## 2015-06-28 ENCOUNTER — Inpatient Hospital Stay: Payer: Managed Care, Other (non HMO)

## 2015-07-05 ENCOUNTER — Ambulatory Visit: Payer: Managed Care, Other (non HMO)

## 2015-07-06 ENCOUNTER — Telehealth: Payer: Self-pay

## 2015-07-06 NOTE — Progress Notes (Signed)
4. 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 He expressed understanding that he is at a small risk for recurrence and may require chemotherapy in the future. If this is the case, his port will likely have to be replaced. We will send a referral to vascular surgery for port removal.

## 2015-07-06 NOTE — Telephone Encounter (Signed)
Patient missed survivorship care plan visit.  T/C to patient and he states he would like SCP mailed to him.  Instructed patient importance of follow up care and to have port-a-cath flushed every 4 to 6 weeks as long as port is in place.  Also instructed patient to call me for any questions about information sent in mail or questions about survivorship.  SCP along with treatment summary mailed to patient.

## 2015-07-09 ENCOUNTER — Other Ambulatory Visit: Payer: Self-pay | Admitting: Vascular Surgery

## 2015-07-10 ENCOUNTER — Ambulatory Visit: Payer: Managed Care, Other (non HMO)

## 2015-07-11 ENCOUNTER — Encounter: Admission: RE | Payer: Self-pay | Source: Ambulatory Visit

## 2015-07-11 ENCOUNTER — Ambulatory Visit: Admission: RE | Admit: 2015-07-11 | Payer: 59 | Source: Ambulatory Visit | Admitting: Vascular Surgery

## 2015-07-11 SURGERY — PORTA CATH REMOVAL
Anesthesia: Moderate Sedation

## 2015-07-11 MED ORDER — HYDROMORPHONE HCL 1 MG/ML IJ SOLN: 1.0000 mg | Freq: Once | INTRAMUSCULAR | Status: DC

## 2015-07-11 MED ORDER — DEXTROSE 5 % IV SOLN: 1.5000 g | INTRAVENOUS | Status: DC

## 2015-07-11 MED ORDER — ONDANSETRON HCL 4 MG/2ML IJ SOLN: 4.0000 mg | Freq: Four times a day (QID) | INTRAMUSCULAR | Status: DC | PRN

## 2015-07-11 MED ORDER — SODIUM CHLORIDE 0.9 % IV SOLN: INTRAVENOUS | Status: DC

## 2015-07-12 ENCOUNTER — Inpatient Hospital Stay: Payer: 59 | Attending: Oncology

## 2015-07-26 ENCOUNTER — Encounter
Admission: RE | Disposition: A | Discharge status: Home / Self Care | Payer: Self-pay | Source: Ambulatory Visit | Attending: Vascular Surgery

## 2015-07-26 ENCOUNTER — Ambulatory Visit
Admission: RE | Admit: 2015-07-26 | Discharge: 2015-07-26 | Disposition: A | Discharge status: Home / Self Care | Payer: 59 | Source: Ambulatory Visit | Attending: Vascular Surgery | Admitting: Vascular Surgery

## 2015-07-26 DIAGNOSIS — C8190 Hodgkin lymphoma, unspecified, unspecified site: Secondary | ICD-10-CM | POA: Insufficient documentation

## 2015-07-26 DIAGNOSIS — Z452 Encounter for adjustment and management of vascular access device: Secondary | ICD-10-CM | POA: Insufficient documentation

## 2015-07-26 HISTORY — PX: PERIPHERAL VASCULAR CATHETERIZATION: SHX172C

## 2015-07-26 SURGERY — PORTA CATH REMOVAL
Anesthesia: Moderate Sedation

## 2015-07-26 MED ORDER — MIDAZOLAM HCL 2 MG/2ML IJ SOLN
INTRAMUSCULAR | Status: AC
  Filled 2015-07-26: qty 2

## 2015-07-26 MED ORDER — LIDOCAINE-EPINEPHRINE (PF) 1 %-1:200000 IJ SOLN
INTRAMUSCULAR | Status: DC | PRN
  Administered 2015-07-26: 20 mL via INTRADERMAL

## 2015-07-26 MED ORDER — LIDOCAINE-EPINEPHRINE (PF) 1 %-1:200000 IJ SOLN
INTRAMUSCULAR | Status: AC
Start: 2015-07-26 — End: 2015-07-26
  Filled 2015-07-26: qty 30

## 2015-07-26 MED ORDER — FENTANYL CITRATE (PF) 100 MCG/2ML IJ SOLN
INTRAMUSCULAR | Status: DC | PRN
  Administered 2015-07-26: 100 ug via INTRAVENOUS

## 2015-07-26 MED ORDER — MIDAZOLAM HCL 2 MG/2ML IJ SOLN
INTRAMUSCULAR | Status: DC | PRN
  Administered 2015-07-26: 2 mg via INTRAVENOUS

## 2015-07-26 MED ORDER — FENTANYL CITRATE (PF) 100 MCG/2ML IJ SOLN
INTRAMUSCULAR | Status: AC
  Filled 2015-07-26: qty 2

## 2015-07-26 SURGICAL SUPPLY — 7 items
DERMABOND ADVANCED (GAUZE/BANDAGES/DRESSINGS) ×1
DERMABOND ADVANCED .7 DNX12 (GAUZE/BANDAGES/DRESSINGS) ×1 IMPLANT
PACK ANGIOGRAPHY (CUSTOM PROCEDURE TRAY) ×2 IMPLANT
PAD GROUND ADULT SPLIT (MISCELLANEOUS) ×2 IMPLANT
PENCIL ELECTRO HAND CTR (MISCELLANEOUS) ×2 IMPLANT
SUT MNCRL AB 4-0 PS2 18 (SUTURE) ×2 IMPLANT
SUTURE VIC 3-0 (SUTURE) ×2 IMPLANT

## 2015-07-26 NOTE — Discharge Instructions (Signed)
Implanted Port Insertion, Care After °Refer to this sheet in the next few weeks. These instructions provide you with information on caring for yourself after your procedure. Your health care provider may also give you more specific instructions. Your treatment has been planned according to current medical practices, but problems sometimes occur. Call your health care provider if you have any problems or questions after your procedure. °WHAT TO EXPECT AFTER THE PROCEDURE °After your procedure, it is typical to have the following:  °· Discomfort at the port insertion site. Ice packs to the area will help. °· Bruising on the skin over the port. This will subside in 3-4 days. °HOME CARE INSTRUCTIONS °· After your port is placed, you will get a manufacturer's information card. The card has information about your port. Keep this card with you at all times.   °· Know what kind of port you have. There are many types of ports available.   °· Wear a medical alert bracelet in case of an emergency. This can help alert health care workers that you have a port.   °· The port can stay in for as long as your health care provider believes it is necessary.   °· A home health care nurse may give medicines and take care of the port.   °· You or a family member can get special training and directions for giving medicine and taking care of the port at home.   °SEEK MEDICAL CARE IF:  °· Your port does not flush or you are unable to get a blood return.   °· You have a fever or chills. °SEEK IMMEDIATE MEDICAL CARE IF: °· You have new fluid or pus coming from your incision.   °· You notice a bad smell coming from your incision site.   °· You have swelling, pain, or more redness at the incision or port site.   °· You have chest pain or shortness of breath. °  °This information is not intended to replace advice given to you by your health care provider. Make sure you discuss any questions you have with your health care provider. °  °Document  Released: 03/30/2013 Document Revised: 06/14/2013 Document Reviewed: 03/30/2013 °Elsevier Interactive Patient Education ©2016 [employer] Inc[employer] ° °

## 2015-07-26 NOTE — H&P (Addendum)
[facility] Admission History & Physical  MRN : [id number]  Nicholas Pfuhl Rahsean Wool. is a 25 y.o. (Jul 07, 1990) male who presents with chief complaint of No chief complaint on file. Marland Kitchen  History of Present Illness: Patient is a 25 year old male with a previous port placed for lymphoma that is now in remission.  He desires to have his port removed.  He has no complaints today  No current facility-administered medications for this encounter.   Facility-Administered Medications Ordered in Other Encounters  Medication Dose Route Frequency Provider Last Rate Last Dose  . sodium chloride 0.9 % injection 10 mL  10 mL Intracatheter PRN Lloyd Huger, MD   10 mL at 01/11/15 0950    Past Medical History  Diagnosis Date  . Neck mass     right   . Cancer [facility])     Hodgkins Lymphoma    Past Surgical History  Procedure Laterality Date  . Peripheral vascular catheterization N/A 11/09/2014    Procedure: Glori Luis Cath Insertion;  Surgeon: Algernon Huxley, MD;  Location: [facility];  Service: Cardiovascular;  Laterality: N/A;    Social History Social History  Substance Use Topics  . Smoking status: Never Smoker   . Smokeless tobacco: Never Used  . Alcohol Use: No     Comment: occasional  No IVDU  Family History Family History  Problem Relation Age of Onset  . Cancer Paternal Grandfather     head and neck cancer  . Diabetes Other   . Hypertension Other   . Cancer Other     history of prostate and kidney cancer  no bleeding or clotting disorders  No Known Allergies   REVIEW OF SYSTEMS (Negative unless checked)  Constitutional: [] Weight loss  [] Fever  [] Chills Cardiac: [] Chest pain   [] Chest pressure   [] Palpitations   [] Shortness of breath when laying flat   [] Shortness of breath at rest   [] Shortness of breath with exertion. Vascular:  [] Pain in legs with walking   [] Pain in legs at rest   [] Pain in legs when laying flat   [] Claudication   [] Pain  in feet when walking  [] Pain in feet at rest  [] Pain in feet when laying flat   [] History of DVT   [] Phlebitis   [] Swelling in legs   [] Varicose veins   [] Non-healing ulcers Pulmonary:   [] Uses home oxygen   [] Productive cough   [] Hemoptysis   [] Wheeze  [] COPD   [] Asthma Neurologic:  [] Dizziness  [] Blackouts   [] Seizures   [] History of stroke   [] History of TIA  [] Aphasia   [] Temporary blindness   [] Dysphagia   [] Weakness or numbness in arms   [] Weakness or numbness in legs Musculoskeletal:  [] Arthritis   [] Joint swelling   [] Joint pain   [] Low back pain Hematologic:  [] Easy bruising  [] Easy bleeding   [] Hypercoagulable state   [] Anemic  [] Hepatitis Gastrointestinal:  [] Blood in stool   [] Vomiting blood  [] Gastroesophageal reflux/heartburn   [] Difficulty swallowing. Genitourinary:  [] Chronic kidney disease   [] Difficult urination  [] Frequent urination  [] Burning with urination   [] Blood in urine Skin:  [] Rashes   [] Ulcers   [] Wounds Psychological:  [] History of anxiety   []  History of major depression.  Physical Examination  Filed Vitals:   07/26/15 0709  BP: 121/55  Pulse: 67  Temp: 98.4 F (36.9 C)  TempSrc: Oral  Resp: 22  SpO2: 100%   There is no weight on file to  calculate BMI. Gen: WD/WN, NAD Head: [place]/AT, No temporalis wasting. Prominent temp pulse not noted. Ear/Nose/Throat: Hearing grossly intact, nares w/o erythema or drainage, oropharynx w/o Erythema/Exudate,  Eyes: PERRLA, EOMI.  Neck: Supple, no nuchal rigidity.  No bruit or JVD.  Pulmonary:  Good air movement, clear to auscultation bilaterally, no use of accessory muscles.  Cardiac: RRR, normal S1, S2, no Murmurs, rubs or gallops. Vascular:  Vessel Right Left  Radial Palpable Palpable  Ulnar Palpable Palpable  Brachial Palpable Palpable  Carotid Palpable, without bruit Palpable, without bruit  Aorta Not palpable N/A  Femoral Palpable Palpable  Popliteal Palpable Palpable  PT Palpable Palpable  DP Palpable Palpable    Gastrointestinal: soft, non-tender/non-distended. No guarding/reflex.  Musculoskeletal: M/S 5/5 throughout.  Extremities without ischemic changes.  No deformity or atrophy.  Neurologic: CN 2-12 intact. Pain and light touch intact in extremities.  Symmetrical.  Speech is fluent. Motor exam as listed above. Psychiatric: Judgment intact, Mood & affect appropriate for pt's clinical situation. Dermatologic: No rashes or ulcers noted.  No cellulitis or open wounds. Lymph : No Cervical, Axillary, or Inguinal lymphadenopathy.      CBC Lab Results  Component Value Date   WBC 23.2* 04/19/2015   HGB 12.4* 04/19/2015   HCT 37.0* 04/19/2015   MCV 83.7 04/19/2015   PLT 227 04/19/2015    BMET    Component Value Date/Time   NA 137 04/19/2015 0840   NA 139 10/11/2014 0931   K 3.6 04/19/2015 0840   K 4.3 10/11/2014 0931   CL 105 04/19/2015 0840   CL 102 10/11/2014 0931   CO2 24 04/19/2015 0840   CO2 29 10/11/2014 0931   GLUCOSE 127* 04/19/2015 0840   GLUCOSE 100* 10/11/2014 0931   BUN 12 04/19/2015 0840   BUN 11 10/11/2014 0931   CREATININE 1.03 04/19/2015 0840   CREATININE 0.89 10/11/2014 0931   CALCIUM 8.9 04/19/2015 0840   CALCIUM 9.5 10/11/2014 0931   GFRNONAA >60 04/19/2015 0840   GFRNONAA >60 10/11/2014 0931   GFRAA >60 04/19/2015 0840   GFRAA >60 10/11/2014 0931   CrCl cannot be calculated (Unknown ideal weight.).  COAG Lab Results  Component Value Date   INR 1.20 11/08/2014   INR 1.2 10/11/2014    Radiology No results found.   Assessment/Plan 1. Lymphoma. Completed therapy.  Can remove port now.  Risks and benefits discussed   Meaghann Choo, MD  07/26/2015 8:09 AM

## 2015-07-26 NOTE — H&P (Signed)
  [facility] History & Physical Update  The patient was interviewed and re-examined.  The patient's previous History and Physical has been reviewed and is unchanged.  There is no change in the plan of care. We plan to proceed with the scheduled procedure.  Alicia Ackert, MD  07/26/2015, 8:08 AM

## 2015-07-26 NOTE — Progress Notes (Signed)
Pt doing well post procedure, parents present, Dr Lucky Cowboy in to speak with pt/family with questions answered, discharge instructions given with questions answered, pt ready for discharge.

## 2015-07-26 NOTE — Op Note (Signed)
[facility]       Operative Note  Date: 07/26/15  Preoperative diagnosis:  1. Hodgkin's Lymphoma, completed therapy and no longer using port  Postoperative diagnosis:  Same as above  Procedures: #1. Removal of left jugular port a cath   Surgeon: Leotis Pain, MD  Anesthesia: Local with moderate conscious sedation for 20 minutes using 2 mg of Versed and 150 mcg of Fentanyl  Fluoroscopy time: none  Contrast used: 0  Estimated blood loss: Minimal  Indication for the procedure:  The patient is a 25 y.o. male who has Hodgkin's disease and has completed his therapy and no longer needs his Port-A-Cath. The patient desires to have this removed. Risks and benefits including need for potential replacement with recurrent disease were discussed and patient is agreeable to proceed.  Description of procedure: The patient was brought to the vascular and interventional radiology suite. Moderate conscious sedation was administered throughout the procedure with my supervision of the RN administering medicines and monitoring the patient's vital signs, pulse oximetry, telemetry and mental status throughout from the start of the procedure until the patient was taken to the recovery room.  The left neck chest and shoulder were sterilely prepped and draped, and a sterile surgical field was created. The area was then anesthetized with 1% lidocaine copiously. The previous incision was reopened and electrocautery used to dissected down to the port and the catheter. These were dissected free and the catheter was gently removed from the vein in its entirety. The port was dissected out from the fibrous connective tissue and the Prolene sutures were removed. The port was then removed in its entirety including the catheter. The wound was then closed with a 3-0 Vicryl and a 4-0 Monocryl and Dermabond was placed as a dressing. The patient was then taken to the  recovery room in stable condition having tolerated the procedure well.  Complications: none  Condition: stable   Jazlyn Tippens, MD 07/26/2015 8:40 AM

## 2015-07-27 ENCOUNTER — Encounter: Payer: Self-pay | Admitting: Vascular Surgery

## 2015-08-01 NOTE — Progress Notes (Signed)
[facility] day:  12/14/2014  Chief Complaint: Nicholas Mierzejewski Khaleel Beckom. is a 25 y.o. male with stage II Hodgkin's lymphoma who is seen for assessment   HPI: The patient was last seen in the medical oncology clinic on 11/30/2014.  At that time, he was seen by Dr. Delight Hoh.  He was doing well.  He received day 14 of cycle #1 ABVD chemotherapy.  Symptomatically, he continues to do well. He voices no complaints. He states that his chest pain went away after his first treatment.  He states that he received Neulasta on 12/02/2014. He denied any achiness. He notes that the lumps in his neck are gone. He denies any neuropathy or constipation.  His diet is good.  Past Medical History  Diagnosis Date  . Neck mass     right   . Cancer [facility])     Hodgkins Lymphoma    Past Surgical History  Procedure Laterality Date  . Peripheral vascular catheterization N/A 11/09/2014    Procedure: Glori Luis Cath Insertion;  Surgeon: Algernon Huxley, MD;  Location: [facility];  Service: Cardiovascular;  Laterality: N/A;  . Peripheral vascular catheterization N/A 07/26/2015    Procedure: Porta Cath Removal;  Surgeon: Algernon Huxley, MD;  Location: [facility];  Service: Cardiovascular;  Laterality: N/A;    Family History  Problem Relation Age of Onset  . Cancer Paternal Grandfather     head and neck cancer  . Diabetes Other   . Hypertension Other   . Cancer Other     history of prostate and kidney cancer    Social History:  reports that he has never smoked. He has never used smokeless tobacco. He reports that he does not drink alcohol or use illicit drugs.  The patient is accompanied by his mother today.  Allergies: No Known Allergies  Current Medications: Current Outpatient Prescriptions  Medication Sig Dispense Refill  . acetaminophen (TYLENOL) 500 MG tablet Take 500 mg by mouth every 6 (six) hours as needed.     No current  facility-administered medications for this visit.   Facility-Administered Medications Ordered in Other Visits  Medication Dose Route Frequency Provider Last Rate Last Dose  . sodium chloride 0.9 % injection 10 mL  10 mL Intracatheter PRN Lloyd Huger, MD   10 mL at 01/11/15 0950    Review of Systems:  GENERAL:  Feels good.  Active.  No fevers, sweats or weight loss. PERFORMANCE STATUS (ECOG):  0 HEENT:  No visual changes, runny nose, sore throat, mouth sores or tenderness. Lungs: No shortness of breath or cough.  No hemoptysis. Cardiac:  No chest pain, palpitations, orthopnea, or PND. GI:  No nausea, vomiting, diarrhea, constipation, melena or hematochezia. GU:  No urgency, frequency, dysuria, or hematuria. Musculoskeletal:  No back pain.  No joint pain.  No muscle tenderness. Extremities:  No pain or swelling. Skin:  No rashes or skin changes. Neuro:  No headache, numbness or weakness, balance or coordination issues. Endocrine:  No diabetes, thyroid issues, hot flashes or night sweats. Psych:  No mood changes, depression or anxiety. Pain:  No focal pain. Review of systems:  All other systems reviewed and found to be negative.  Physical Exam: Blood pressure 110/70, pulse 64, temperature 97.2 F (36.2 C), temperature source Tympanic, resp. rate 16, weight 312 lb 2.7 oz (141.6 kg). GENERAL:  Well developed, well nourished, sitting comfortably in the exam room in no acute distress.  MENTAL STATUS:  Alert and oriented to person, place and time. HEAD:  Normocephalic, atraumatic, face symmetric, no Cushingoid features. EYES: Pupils equal round and reactive to light and accomodation.  No conjunctivitis or scleral icterus. ENT:  Oropharynx clear without lesion.  Tongue normal. Mucous membranes moist.  RESPIRATORY:  Clear to auscultation without rales, wheezes or rhonchi. CARDIOVASCULAR:  Regular rate and rhythm without murmur, rub or gallop. ABDOMEN:  Soft, non-tender, with active  bowel sounds, and no hepatosplenomegaly.  No masses. SKIN:  Tattoo right upper arm.  No rashes, ulcers or lesions. EXTREMITIES: No edema, no skin discoloration or tenderness.  No palpable cords. LYMPH NODES: No palpable cervical, supraclavicular, axillary or inguinal adenopathy  NEUROLOGICAL: Unremarkable. PSYCH:  Appropriate.  Infusion on 12/14/2014  Component Date Value Ref Range Status  . Sodium 12/14/2014 137  135 - 145 mmol/L Final  . Potassium 12/14/2014 3.8  3.5 - 5.1 mmol/L Final  . Chloride 12/14/2014 105  101 - 111 mmol/L Final  . CO2 12/14/2014 25  22 - 32 mmol/L Final  . Glucose, Bld 12/14/2014 98  65 - 99 mg/dL Final  . BUN 12/14/2014 16  6 - 20 mg/dL Final  . Creatinine, Ser 12/14/2014 1.02  0.61 - 1.24 mg/dL Final  . Calcium 12/14/2014 8.6* 8.9 - 10.3 mg/dL Final  . Total Protein 12/14/2014 7.7  6.5 - 8.1 g/dL Final  . Albumin 12/14/2014 4.1  3.5 - 5.0 g/dL Final  . AST 12/14/2014 47* 15 - 41 U/L Final  . ALT 12/14/2014 86* 17 - 63 U/L Final  . Alkaline Phosphatase 12/14/2014 80  38 - 126 U/L Final  . Total Bilirubin 12/14/2014 0.3  0.3 - 1.2 mg/dL Final  . GFR calc non Af Amer 12/14/2014 >60  >60 mL/min Final  . GFR calc Af Amer 12/14/2014 >60  >60 mL/min Final   Comment: (NOTE) The eGFR has been calculated using the CKD EPI equation. This calculation has not been validated in all clinical situations. eGFR's persistently <60 mL/min signify possible Chronic Kidney Disease.   . Anion gap 12/14/2014 7  5 - 15 Final  . WBC 12/14/2014 5.9  3.8 - 10.6 K/uL Final  . RBC 12/14/2014 4.98  4.40 - 5.90 MIL/uL Final  . Hemoglobin 12/14/2014 11.7* 13.0 - 18.0 g/dL Final  . HCT 12/14/2014 36.8* 40.0 - 52.0 % Final  . MCV 12/14/2014 73.9* 80.0 - 100.0 fL Final  . MCH 12/14/2014 23.4* 26.0 - 34.0 pg Final  . MCHC 12/14/2014 31.7* 32.0 - 36.0 g/dL Final  . RDW 12/14/2014 19.0* 11.5 - 14.5 % Final  . Platelets 12/14/2014 150  150 - 440 K/uL Final  . Neutrophils Relative %  12/14/2014 72   Final  . Neutro Abs 12/14/2014 4.2  1.4 - 6.5 K/uL Final  . Lymphocytes Relative 12/14/2014 19   Final  . Lymphs Abs 12/14/2014 1.1  1.0 - 3.6 K/uL Final  . Monocytes Relative 12/14/2014 8   Final  . Monocytes Absolute 12/14/2014 0.5  0.2 - 1.0 K/uL Final  . Eosinophils Relative 12/14/2014 1   Final  . Eosinophils Absolute 12/14/2014 0.1  0 - 0.7 K/uL Final  . Basophils Relative 12/14/2014 0   Final  . Basophils Absolute 12/14/2014 0.0  0 - 0.1 K/uL Final    Assessment:  Nicholas Ballard. is a 25 y.o. male with stage II Hodgkin's disease.  Right neck node biopsy on 10/13/2014 revealed classical Hodgkin's lymphoma.  PET scan on 10/16/2014 revealed lymphoma within  the neck and chest.  There was no convincing evidence of subdiaphragmatic disease.  He has bilateral prominent inguinal nodes with low-level hypermetabolism (favored reactive).  Bone marrow on 11/09/2014 was negative.  MUGA on 11/03/2014 revealed an ejection fraction of 64%.  PFTs by report were normal.  He has a microcytic anemia.  Diet is good.  Symptomatically, he is doing well. The adenopathy in his neck has resolved. Chest pain is gone. Exam is unremarkable.  Plan: 1. Labs today: CBC with diff, CMP. 2. Day 1 of cycle #2 ABVD chemotherapy. 3. Neulasta on Saturday. 4. RTC in 2 weeks for MD assessment (Dr Grayland Ormond), labs (CBC with diff, CMP, ferritin, iron studies), and day 15 of cycle #2 ABVD.   Lequita Asal, MD  12/14/2014

## 2015-08-23 ENCOUNTER — Other Ambulatory Visit: Payer: Managed Care, Other (non HMO)

## 2015-08-23 ENCOUNTER — Ambulatory Visit: Payer: Managed Care, Other (non HMO) | Admitting: Oncology

## 2015-08-28 ENCOUNTER — Inpatient Hospital Stay: Payer: 59 | Attending: Oncology

## 2015-08-28 ENCOUNTER — Inpatient Hospital Stay: Payer: 59

## 2015-08-28 ENCOUNTER — Inpatient Hospital Stay (HOSPITAL_BASED_OUTPATIENT_CLINIC_OR_DEPARTMENT_OTHER): Payer: 59 | Admitting: Oncology

## 2015-08-28 VITALS — BP 124/78 | HR 95 | Temp 97.4°F | Resp 16

## 2015-08-28 DIAGNOSIS — C8190 Hodgkin lymphoma, unspecified, unspecified site: Secondary | ICD-10-CM

## 2015-08-28 DIAGNOSIS — Z79899 Other long term (current) drug therapy: Secondary | ICD-10-CM

## 2015-08-28 DIAGNOSIS — C8111 Nodular sclerosis classical Hodgkin lymphoma, lymph nodes of head, face, and neck: Secondary | ICD-10-CM | POA: Insufficient documentation

## 2015-08-28 LAB — CBC WITH DIFFERENTIAL/PLATELET
BASOS ABS: 0 10*3/uL (ref 0–0.1)
BASOS PCT: 1 %
EOS ABS: 0.1 10*3/uL (ref 0–0.7)
EOS PCT: 2 %
HCT: 41.1 % (ref 40.0–52.0)
Hemoglobin: 13.8 g/dL (ref 13.0–18.0)
Lymphocytes Relative: 29 %
Lymphs Abs: 1 10*3/uL (ref 1.0–3.6)
MCH: 26.4 pg (ref 26.0–34.0)
MCHC: 33.7 g/dL (ref 32.0–36.0)
MCV: 78.4 fL — ABNORMAL LOW (ref 80.0–100.0)
MONO ABS: 0.3 10*3/uL (ref 0.2–1.0)
Monocytes Relative: 8 %
Neutro Abs: 2.1 10*3/uL (ref 1.4–6.5)
Neutrophils Relative %: 60 %
PLATELETS: 171 10*3/uL (ref 150–440)
RBC: 5.24 MIL/uL (ref 4.40–5.90)
RDW: 15.5 % — AB (ref 11.5–14.5)
WBC: 3.4 10*3/uL — AB (ref 3.8–10.6)

## 2015-08-28 LAB — COMPREHENSIVE METABOLIC PANEL
ALT: 16 U/L — AB (ref 17–63)
AST: 14 U/L — AB (ref 15–41)
Albumin: 4.3 g/dL (ref 3.5–5.0)
Alkaline Phosphatase: 90 U/L (ref 38–126)
Anion gap: 7 (ref 5–15)
BUN: 12 mg/dL (ref 6–20)
CHLORIDE: 105 mmol/L (ref 101–111)
CO2: 26 mmol/L (ref 22–32)
CREATININE: 1.01 mg/dL (ref 0.61–1.24)
Calcium: 9.1 mg/dL (ref 8.9–10.3)
GFR calc Af Amer: >60 mL/min (ref >60)
GFR calc non Af Amer: >60 mL/min (ref >60)
Glucose, Bld: 90 mg/dL (ref 65–99)
POTASSIUM: 4.2 mmol/L (ref 3.5–5.1)
SODIUM: 138 mmol/L (ref 135–145)
Total Bilirubin: 0.5 mg/dL (ref 0.3–1.2)
Total Protein: 7.6 g/dL (ref 6.5–8.1)

## 2015-08-28 NOTE — Progress Notes (Signed)
Patient does not offer any problems today.  

## 2015-08-28 NOTE — Progress Notes (Signed)
Nicholas Ballard  [phone] [phone]  ID: Nicholas Ballard. OB: Feb 10, 1991  MR#: [id number]  [id number]  Patient Care Team: [name], MD as PCP - General (Family Medicine)  CHIEF COMPLAINT:  Chief Complaint  Patient presents with  . Lymphoma    INTERVAL HISTORY: Patient returns to clinic today for laboratory work and further evaluation. He had his port removed on July 26, 2015.  He currently feels well and is asymptomatic. He has no neurologic complaints. He denies any fevers, chills, night sweats, or weight loss. He denies any difficulty swallowing or dysphagia. He has no chest pain, shortness of breath, or cough. He denies any nausea, vomiting, constipation, or diarrhea. He has no urinary complaints. Patient offers no specific complaints today.   REVIEW OF SYSTEMS:   Review of Systems  Constitutional: Negative.   HENT: Negative.        Denies dysphagia.  Respiratory: Negative.   Cardiovascular: Negative.   Gastrointestinal: Negative.   Genitourinary: Negative.     As per HPI. Otherwise, a complete review of systems is negatve.  PAST MEDICAL HISTORY: Past Medical History  Diagnosis Date  . Neck mass     right   . Cancer ([place])     Hodgkins Lymphoma    PAST SURGICAL HISTORY: Past Surgical History  Procedure Laterality Date  . Peripheral vascular catheterization N/A 11/09/2014    Procedure: Glori Luis Cath Insertion;  Surgeon: Algernon Huxley, MD;  Location: [facility];  Service: Cardiovascular;  Laterality: N/A;  . Peripheral vascular catheterization N/A 07/26/2015    Procedure: Porta Cath Removal;  Surgeon: Algernon Huxley, MD;  Location: [facility];  Service: Cardiovascular;  Laterality: N/A;    FAMILY HISTORY Family History  Problem Relation Age of Onset  . Cancer Paternal Grandfather     head and neck cancer  . Diabetes Other   . Hypertension Other   . Cancer Other     history of prostate and  kidney cancer       ADVANCED DIRECTIVES:    HEALTH MAINTENANCE: Social History  Substance Use Topics  . Smoking status: Never Smoker   . Smokeless tobacco: Never Used  . Alcohol Use: No     Comment: occasional     No Known Allergies  Current Outpatient Prescriptions  Medication Sig Dispense Refill  . acetaminophen (TYLENOL) 500 MG tablet Take 500 mg by mouth every 6 (six) hours as needed.     No current facility-administered medications for this visit.   Facility-Administered Medications Ordered in Other Visits  Medication Dose Route Frequency Provider Last Rate Last Dose  . sodium chloride 0.9 % injection 10 mL  10 mL Intracatheter PRN Lloyd Huger, MD   10 mL at 01/11/15 0950    OBJECTIVE: Filed Vitals:   08/28/15 1526  BP: 124/78  Pulse: 95  Temp: 97.4 F (36.3 C)  Resp: 16     There is no weight on file to calculate BMI.    ECOG FS:0 - Asymptomatic  General: Well-developed, well-nourished, no acute distress. Eyes: anicteric sclera. HEENT: Right neck/supraclavicular mass is no longer palpable Lungs: Clear to auscultation bilaterally. Heart: Regular rate and rhythm. No rubs, murmurs, or gallops. Abdomen: Soft, nontender, nondistended. No organomegaly noted, normoactive bowel sounds. Musculoskeletal: No edema, cyanosis, or clubbing. Neuro: Alert, answering all questions appropriately. Cranial nerves grossly intact. Skin: No rashes or petechiae noted. Psych: Normal affect.    LAB RESULTS:  Lab Results  Component Value Date   NA 138 08/28/2015   K 4.2 08/28/2015   CL 105 08/28/2015   CO2 26 08/28/2015   GLUCOSE 90 08/28/2015   BUN 12 08/28/2015   CREATININE 1.01 08/28/2015   CALCIUM 9.1 08/28/2015   PROT 7.6 08/28/2015   ALBUMIN 4.3 08/28/2015   AST 14* 08/28/2015   ALT 16* 08/28/2015   ALKPHOS 90 08/28/2015   BILITOT 0.5 08/28/2015   GFRNONAA >60 08/28/2015   GFRAA >60 08/28/2015    Lab Results  Component Value Date   WBC 3.4*  08/28/2015   NEUTROABS 2.1 08/28/2015   HGB 13.8 08/28/2015   HCT 41.1 08/28/2015   MCV 78.4* 08/28/2015   PLT 171 08/28/2015     STUDIES: No results found.  ASSESSMENT: Stage II Hodgkin's lymphoma.  PLAN:    1. Hodgkin's lymphoma: PET scan results from May 21, 2015 reported as above with no evidence of disease. Plan to do imaging with CT scan every 6 months for the next 2 years and then yearly thereafter. Patient will return to clinic in 3 months for repeat laboratory work and further evaluation and CT results.  2. Leukopenia: Resolved.  3. Anemia: Resolved.  Patient expressed understanding and was in agreement with this plan. He also understands that He can call clinic at any time with any questions, concerns, or complaints.    Mayra Reel, NP   08/28/2015 3:56 PM  Patient was seen and evaluated independently and I agree with the assessment and plan as above.  Lloyd Huger, MD 08/31/2015 10:41 PM

## 2015-11-29 ENCOUNTER — Ambulatory Visit
Admission: RE | Admit: 2015-11-29 | Discharge: 2015-11-29 | Disposition: A | Discharge status: Home / Self Care | Payer: 59 | Source: Ambulatory Visit | Attending: Oncology | Admitting: Oncology

## 2015-11-29 ENCOUNTER — Ambulatory Visit: Payer: 59

## 2015-11-29 DIAGNOSIS — C8111 Nodular sclerosis classical Hodgkin lymphoma, lymph nodes of head, face, and neck: Secondary | ICD-10-CM | POA: Diagnosis present

## 2015-11-29 DIAGNOSIS — I517 Cardiomegaly: Secondary | ICD-10-CM | POA: Insufficient documentation

## 2015-11-29 DIAGNOSIS — R591 Generalized enlarged lymph nodes: Secondary | ICD-10-CM | POA: Diagnosis not present

## 2015-11-29 MED ORDER — IOPAMIDOL (ISOVUE-300) INJECTION 61%
75.0000 mL | Freq: Once | INTRAVENOUS | Status: AC | PRN
  Administered 2015-11-29: 75 mL via INTRAVENOUS

## 2015-12-06 ENCOUNTER — Inpatient Hospital Stay (HOSPITAL_BASED_OUTPATIENT_CLINIC_OR_DEPARTMENT_OTHER): Payer: 59 | Admitting: Oncology

## 2015-12-06 ENCOUNTER — Inpatient Hospital Stay: Payer: 59 | Attending: Oncology

## 2015-12-06 VITALS — BP 124/78 | HR 65 | Temp 95.9°F | Resp 18 | Wt 316.8 lb

## 2015-12-06 DIAGNOSIS — C8198 Hodgkin lymphoma, unspecified, lymph nodes of multiple sites: Secondary | ICD-10-CM

## 2015-12-06 DIAGNOSIS — D72819 Decreased white blood cell count, unspecified: Secondary | ICD-10-CM | POA: Diagnosis not present

## 2015-12-06 DIAGNOSIS — Z79899 Other long term (current) drug therapy: Secondary | ICD-10-CM | POA: Diagnosis not present

## 2015-12-06 DIAGNOSIS — Z8571 Personal history of Hodgkin lymphoma: Secondary | ICD-10-CM

## 2015-12-06 DIAGNOSIS — C8111 Nodular sclerosis classical Hodgkin lymphoma, lymph nodes of head, face, and neck: Secondary | ICD-10-CM

## 2015-12-06 LAB — CBC WITH DIFFERENTIAL/PLATELET
Basophils Absolute: 0 10*3/uL (ref 0–0.1)
Basophils Relative: 0 %
Eosinophils Absolute: 0.1 10*3/uL (ref 0–0.7)
Eosinophils Relative: 2 %
HEMATOCRIT: 36.8 % — AB (ref 40.0–52.0)
Hemoglobin: 12.7 g/dL — ABNORMAL LOW (ref 13.0–18.0)
LYMPHS ABS: 1.1 10*3/uL (ref 1.0–3.6)
LYMPHS PCT: 33 %
MCH: 27.3 pg (ref 26.0–34.0)
MCHC: 34.4 g/dL (ref 32.0–36.0)
MCV: 79.6 fL — AB (ref 80.0–100.0)
MONO ABS: 0.3 10*3/uL (ref 0.2–1.0)
MONOS PCT: 9 %
NEUTROS ABS: 1.9 10*3/uL (ref 1.4–6.5)
Neutrophils Relative %: 56 %
Platelets: 174 10*3/uL (ref 150–440)
RBC: 4.63 MIL/uL (ref 4.40–5.90)
RDW: 15.6 % — AB (ref 11.5–14.5)
WBC: 3.5 10*3/uL — ABNORMAL LOW (ref 3.8–10.6)

## 2015-12-06 LAB — COMPREHENSIVE METABOLIC PANEL
ALBUMIN: 4.5 g/dL (ref 3.5–5.0)
ALK PHOS: 73 U/L (ref 38–126)
ALT: 12 U/L — AB (ref 17–63)
AST: 17 U/L (ref 15–41)
Anion gap: 8 (ref 5–15)
BUN: 12 mg/dL (ref 6–20)
CALCIUM: 9.5 mg/dL (ref 8.9–10.3)
CHLORIDE: 105 mmol/L (ref 101–111)
CO2: 26 mmol/L (ref 22–32)
Creatinine, Ser: 1.13 mg/dL (ref 0.61–1.24)
GFR calc non Af Amer: >60 mL/min (ref >60)
Glucose, Bld: 88 mg/dL (ref 65–99)
Potassium: 3.5 mmol/L (ref 3.5–5.1)
Sodium: 139 mmol/L (ref 135–145)
TOTAL PROTEIN: 7.7 g/dL (ref 6.5–8.1)
Total Bilirubin: 0.7 mg/dL (ref 0.3–1.2)

## 2015-12-16 NOTE — Progress Notes (Signed)
[place]  Telephone:([phone] [phone]  ID: Nicholas Ballard. OB: [birth year]  MR#: [id number]  [id number]  Patient Care Team: Lloyd Huger, MD as PCP - General (Oncology)  CHIEF COMPLAINT:  Chief Complaint  Patient presents with  . Lymphoma    INTERVAL HISTORY: Patient returns to clinic today for laboratory work, discussion of his imaging results, and further evaluation. He had his port removed on July 26, 2015.  He currently feels well and is asymptomatic. He has no neurologic complaints. He denies any fevers, chills, night sweats, or weight loss. He denies any difficulty swallowing or dysphagia. He has no chest pain, shortness of breath, or cough. He denies any nausea, vomiting, constipation, or diarrhea. He has no urinary complaints. Patient offers no specific complaints today.   REVIEW OF SYSTEMS:   Review of Systems  Constitutional: Negative.  Negative for fever, weight loss and malaise/fatigue.  HENT: Negative.  Negative for sore throat.   Respiratory: Negative.  Negative for cough and shortness of breath.   Cardiovascular: Negative.  Negative for chest pain.  Gastrointestinal: Negative.  Negative for abdominal pain.  Genitourinary: Negative.   Musculoskeletal: Negative.   Neurological: Negative.  Negative for weakness.  Psychiatric/Behavioral: Negative.     As per HPI. Otherwise, a complete review of systems is negatve.  PAST MEDICAL HISTORY: Past Medical History  Diagnosis Date  . Neck mass     right   . Cancer ([place])     Hodgkins Lymphoma    PAST SURGICAL HISTORY: Past Surgical History  Procedure Laterality Date  . Peripheral vascular catheterization N/A 11/09/2014    Procedure: Glori Luis Cath Insertion;  Surgeon: Algernon Huxley, MD;  Location: [facility];  Service: Cardiovascular;  Laterality: N/A;  . Peripheral vascular catheterization N/A 07/26/2015    Procedure: Porta Cath Removal;  Surgeon: Algernon Huxley, MD;  Location: [facility];  Service: Cardiovascular;  Laterality: N/A;    FAMILY HISTORY Family History  Problem Relation Age of Onset  . Cancer Paternal Grandfather     head and neck cancer  . Diabetes Other   . Hypertension Other   . Cancer Other     history of prostate and kidney cancer       ADVANCED DIRECTIVES:    HEALTH MAINTENANCE: Social History  Substance Use Topics  . Smoking status: Never Smoker   . Smokeless tobacco: Never Used  . Alcohol Use: No     Comment: occasional     No Known Allergies  Current Outpatient Prescriptions  Medication Sig Dispense Refill  . acetaminophen (TYLENOL) 500 MG tablet Take 500 mg by mouth every 6 (six) hours as needed.     No current facility-administered medications for this visit.   Facility-Administered Medications Ordered in Other Visits  Medication Dose Route Frequency Provider Last Rate Last Dose  . sodium chloride 0.9 % injection 10 mL  10 mL Intracatheter PRN Lloyd Huger, MD   10 mL at 01/11/15 0950    OBJECTIVE: Filed Vitals:   12/06/15 1504  BP: 124/78  Pulse: 65  Temp: 95.9 F (35.5 C)  Resp: 18     Body mass index is 41.81 kg/(m^2).    ECOG FS:0 - Asymptomatic  General: Well-developed, well-nourished, no acute distress. Eyes: anicteric sclera. HEENT: Right neck/supraclavicular mass is no longer palpable Lungs: Clear to auscultation bilaterally. Heart: Regular rate and rhythm. No rubs, murmurs, or gallops. Abdomen: Soft, nontender, nondistended. No organomegaly noted, normoactive  bowel sounds. Musculoskeletal: No edema, cyanosis, or clubbing. Neuro: Alert, answering all questions appropriately. Cranial nerves grossly intact. Skin: No rashes or petechiae noted. Psych: Normal affect.    LAB RESULTS:  Lab Results  Component Value Date   NA 139 12/06/2015   K 3.5 12/06/2015   CL 105 12/06/2015   CO2 26 12/06/2015   GLUCOSE 88 12/06/2015   BUN 12 12/06/2015   CREATININE 1.13  12/06/2015   CALCIUM 9.5 12/06/2015   PROT 7.7 12/06/2015   ALBUMIN 4.5 12/06/2015   AST 17 12/06/2015   ALT 12* 12/06/2015   ALKPHOS 73 12/06/2015   BILITOT 0.7 12/06/2015   GFRNONAA >60 12/06/2015   GFRAA >60 12/06/2015    Lab Results  Component Value Date   WBC 3.5* 12/06/2015   NEUTROABS 1.9 12/06/2015   HGB 12.7* 12/06/2015   HCT 36.8* 12/06/2015   MCV 79.6* 12/06/2015   PLT 174 12/06/2015     STUDIES: Ct Soft Tissue Neck W Contrast  11/29/2015  CLINICAL DATA:  25 year old male with treated Hodgkin lymphoma. Restaging. Subsequent encounter. EXAM: CT NECK WITH CONTRAST TECHNIQUE: Multidetector CT imaging of the neck was performed using the standard protocol following the bolus administration of intravenous contrast. CONTRAST:  1mL ISOVUE-300 IOPAMIDOL (ISOVUE-300) INJECTION 61% in conjunction with contrast enhanced imaging of the chest reported separately. COMPARISON:  PET-CT 05/21/2015.  Neck CT 10/06/2014 FINDINGS: Pharynx and larynx: Negative. Negative parapharyngeal and retropharyngeal spaces. Salivary glands: Negative sublingual space, submandibular glands, and parotid glands. Thyroid: Negative. Lymph nodes: Marked regression with minimal residual soft tissue abnormality at the site of the right lower neck and upper chest 7 cm mass demonstrated in April 2016. Mild residual scarring along the anterior right sternocleidomastoid muscle and insertion today (series 2, image 77), stable from November 2016 PET-CT. Paucity of right level 4 lymph nodes. Residual small left level 4 nodes measuring 7-8 mm short axis (11-12 mm at the time of diagnosis). Diminutive right level 3 and stable sub cm left level 3 nodes. Stable bilateral level 2 nodes which are larger measuring 9-10 mm short axis, but demonstrate stable fatty hila. Stable small level 1 lymph nodes. Stable occasional small level 5 nodes. No increased or abnormal nodes today. Vascular: Suboptimal intravascular contrast bolus. Grossly  patent major vascular structures. The right vertebral artery appears dominant negative. Limited intracranial: Negative. Visualized orbits: Mastoids and visualized paranasal sinuses: Stable and clear aside from minimal left maxillary alveolar recess mucosal thickening. Skeleton: No osseous abnormality identified. Upper chest: Reported separately today. IMPRESSION: 1. Resolved large superficial right lower neck and upper chest mass. Mild residual soft tissue scarring stable from November 2016 PET-CT. 2. No pathologic lymph nodes in the neck.  No new neck abnormality. 3. Chest CT today reported separately. Electronically Signed   By: Genevie Ann M.D.   On: 11/29/2015 10:32   Ct Chest W Contrast  11/29/2015  CLINICAL DATA:  25 year old male with history of Hodgkin's lymphoma diagnosed 1 year ago. Chemotherapy completed 7 months ago. Followup study. EXAM: CT CHEST WITH CONTRAST TECHNIQUE: Multidetector CT imaging of the chest was performed during intravenous contrast administration. CONTRAST:  47mL ISOVUE-300 IOPAMIDOL (ISOVUE-300) INJECTION 61% COMPARISON:  PET-CT 05/21/2015. FINDINGS: Mediastinum/Lymph Nodes: There continue to be several mildly enlarged lymph nodes and extensive amorphous nodal tissue throughout the mediastinum. While these are generally smaller than the prior PET-CT, there is one exception, which is a 20 mm short axis low-attenuation lymph node in the prevascular nodal station (image 20 of series 3), which  is unchanged compared to the prior study. Other lymph nodes have clearly decreased, including a 1.3 cm short axis high right paratracheal lymph node (image 15 of series 3) which previously measured up to 18 mm in short axis. Heart size is mildly enlarged. There is no significant pericardial fluid, thickening or pericardial calcification. Esophagus is unremarkable in appearance. No axillary lymphadenopathy. Lungs/Pleura: No acute consolidative airspace disease. No pleural effusions. No suspicious  appearing pulmonary nodules or masses. Upper Abdomen: Unremarkable. Musculoskeletal/Soft Tissues: There are no aggressive appearing lytic or blastic lesions noted in the visualized portions of the skeleton. IMPRESSION: 1. All lymphadenopathy in the thorax is either improved or stable compared to the prior PET-CT 05/21/2015, as discussed above. Note new lymphadenopathy otherwise noted. 2. Mild cardiomegaly. Electronically Signed   By: Vinnie Langton M.D.   On: 11/29/2015 10:45    ASSESSMENT: Stage II Hodgkin's lymphoma.  PLAN:    1. Hodgkin's lymphoma: Patient completed his chemotherapy with ABVD on April 18, 2016. CT scan results from November 29, 2015 reviewed independently and reported as above with no evidence of disease. Plan to do imaging with CT scan every 6 months for 2 years and then yearly thereafter. Patient will return to clinic in 6 months for repeat laboratory work and CT results.  2. Leukopenia: Mild, monitor.  3. Anemia: Resolved.  Patient expressed understanding and was in agreement with this plan. He also understands that He can call clinic at any time with any questions, concerns, or complaints.    Lloyd Huger, MD   12/16/2015 10:06 AM

## 2016-06-05 ENCOUNTER — Inpatient Hospital Stay: Payer: 59 | Attending: Oncology

## 2016-06-05 ENCOUNTER — Other Ambulatory Visit: Payer: 59

## 2016-06-05 ENCOUNTER — Ambulatory Visit
Admission: RE | Admit: 2016-06-05 | Discharge: 2016-06-05 | Disposition: A | Discharge status: Home / Self Care | Payer: 59 | Source: Ambulatory Visit | Attending: Oncology | Admitting: Oncology

## 2016-06-05 DIAGNOSIS — C8111 Nodular sclerosis classical Hodgkin lymphoma, lymph nodes of head, face, and neck: Secondary | ICD-10-CM | POA: Insufficient documentation

## 2016-06-05 DIAGNOSIS — Z9221 Personal history of antineoplastic chemotherapy: Secondary | ICD-10-CM | POA: Diagnosis not present

## 2016-06-05 DIAGNOSIS — D72819 Decreased white blood cell count, unspecified: Secondary | ICD-10-CM | POA: Diagnosis not present

## 2016-06-05 DIAGNOSIS — Z923 Personal history of irradiation: Secondary | ICD-10-CM | POA: Diagnosis not present

## 2016-06-05 LAB — CBC WITH DIFFERENTIAL/PLATELET
BASOS PCT: 0 %
Basophils Absolute: 0 10*3/uL (ref 0–0.1)
EOS ABS: 0.1 10*3/uL (ref 0–0.7)
EOS PCT: 2 %
HCT: 39.3 % — ABNORMAL LOW (ref 40.0–52.0)
HEMOGLOBIN: 13.3 g/dL (ref 13.0–18.0)
Lymphocytes Relative: 34 %
Lymphs Abs: 1.2 10*3/uL (ref 1.0–3.6)
MCH: 27.4 pg (ref 26.0–34.0)
MCHC: 34 g/dL (ref 32.0–36.0)
MCV: 80.7 fL (ref 80.0–100.0)
Monocytes Absolute: 0.3 10*3/uL (ref 0.2–1.0)
Monocytes Relative: 8 %
NEUTROS PCT: 56 %
Neutro Abs: 2 10*3/uL (ref 1.4–6.5)
PLATELETS: 181 10*3/uL (ref 150–440)
RBC: 4.87 MIL/uL (ref 4.40–5.90)
RDW: 14.6 % — ABNORMAL HIGH (ref 11.5–14.5)
WBC: 3.5 10*3/uL — AB (ref 3.8–10.6)

## 2016-06-05 LAB — COMPREHENSIVE METABOLIC PANEL
ALBUMIN: 4.5 g/dL (ref 3.5–5.0)
ALK PHOS: 61 U/L (ref 38–126)
ALT: 12 U/L — AB (ref 17–63)
ANION GAP: 7 (ref 5–15)
AST: 16 U/L (ref 15–41)
BUN: 12 mg/dL (ref 6–20)
CALCIUM: 9.2 mg/dL (ref 8.9–10.3)
CHLORIDE: 107 mmol/L (ref 101–111)
CO2: 26 mmol/L (ref 22–32)
Creatinine, Ser: 1.03 mg/dL (ref 0.61–1.24)
GFR calc non Af Amer: >60 mL/min (ref >60)
GLUCOSE: 101 mg/dL — AB (ref 65–99)
Potassium: 3.8 mmol/L (ref 3.5–5.1)
SODIUM: 140 mmol/L (ref 135–145)
Total Bilirubin: 0.7 mg/dL (ref 0.3–1.2)
Total Protein: 7.6 g/dL (ref 6.5–8.1)

## 2016-06-05 MED ORDER — IOPAMIDOL (ISOVUE-300) INJECTION 61%
75.0000 mL | Freq: Once | INTRAVENOUS | Status: AC | PRN
  Administered 2016-06-05: 75 mL via INTRAVENOUS

## 2016-06-11 NOTE — Progress Notes (Signed)
[place]  [phone] [phone]  ID: Nicholas Ballard. OB: 14-Apr-1991  MR#: [id number]  [id number]  Patient Care Team: Lloyd Huger, MD as PCP - General (Oncology)  CHIEF COMPLAINT: Stage II Hodgkin's disease.  INTERVAL HISTORY: Patient returns to clinic today for laboratory work, discussion of his imaging results, and further evaluation.  He continues to feel well and is asymptomatic. He has no neurologic complaints. He denies any fevers, chills, night sweats, or weight loss. He denies any difficulty swallowing or dysphagia. He has no chest pain, shortness of breath, or cough. He denies any nausea, vomiting, constipation, or diarrhea. He has no urinary complaints. Patient offers no specific complaints today.   REVIEW OF SYSTEMS:   Review of Systems  Constitutional: Negative.  Negative for fever, malaise/fatigue and weight loss.  HENT: Negative.  Negative for sore throat.   Respiratory: Negative.  Negative for cough and shortness of breath.   Cardiovascular: Negative.  Negative for chest pain.  Gastrointestinal: Negative.  Negative for abdominal pain.  Genitourinary: Negative.   Musculoskeletal: Negative.   Neurological: Negative.  Negative for weakness.  Psychiatric/Behavioral: Negative.     As per HPI. Otherwise, a complete review of systems is negative.  PAST MEDICAL HISTORY: Past Medical History:  Diagnosis Date  . Cancer ([place])    Hodgkins Lymphoma  . Neck mass    right     PAST SURGICAL HISTORY: Past Surgical History:  Procedure Laterality Date  . PERIPHERAL VASCULAR CATHETERIZATION N/A 11/09/2014   Procedure: Glori Luis Cath Insertion;  Surgeon: Algernon Huxley, MD;  Location: [facility];  Service: Cardiovascular;  Laterality: N/A;  . PERIPHERAL VASCULAR CATHETERIZATION N/A 07/26/2015   Procedure: Glori Luis Cath Removal;  Surgeon: Algernon Huxley, MD;  Location: [facility];  Service: Cardiovascular;   Laterality: N/A;    FAMILY HISTORY Family History  Problem Relation Age of Onset  . Cancer Paternal Grandfather     head and neck cancer  . Diabetes Other   . Hypertension Other   . Cancer Other     history of prostate and kidney cancer       ADVANCED DIRECTIVES:    HEALTH MAINTENANCE: Social History  Substance Use Topics  . Smoking status: Never Smoker  . Smokeless tobacco: Never Used  . Alcohol use No     Comment: occasional     No Known Allergies  Current Outpatient Prescriptions  Medication Sig Dispense Refill  . acetaminophen (TYLENOL) 500 MG tablet Take 500 mg by mouth every 6 (six) hours as needed.     No current facility-administered medications for this visit.    Facility-Administered Medications Ordered in Other Visits  Medication Dose Route Frequency Provider Last Rate Last Dose  . sodium chloride 0.9 % injection 10 mL  10 mL Intracatheter PRN Lloyd Huger, MD   10 mL at 01/11/15 0950    OBJECTIVE: Vitals:   06/12/16 1456  BP: 115/70  Pulse: 73  Resp: 18  Temp: 98.1 F (36.7 C)     Body mass index is 42 kg/m.    ECOG FS:0 - Asymptomatic  General: Well-developed, well-nourished, no acute distress. Eyes: anicteric sclera. HEENT: Right neck/supraclavicular mass is no longer palpable Lungs: Clear to auscultation bilaterally. Heart: Regular rate and rhythm. No rubs, murmurs, or gallops. Abdomen: Soft, nontender, nondistended. No organomegaly noted, normoactive bowel sounds. Musculoskeletal: No edema, cyanosis, or clubbing. Neuro: Alert, answering all questions appropriately. Cranial nerves grossly intact. Skin: No rashes  or petechiae noted. Psych: Normal affect.    LAB RESULTS:  Lab Results  Component Value Date   NA 140 06/05/2016   K 3.8 06/05/2016   CL 107 06/05/2016   CO2 26 06/05/2016   GLUCOSE 101 (H) 06/05/2016   BUN 12 06/05/2016   CREATININE 1.03 06/05/2016   CALCIUM 9.2 06/05/2016   PROT 7.6 06/05/2016   ALBUMIN 4.5  06/05/2016   AST 16 06/05/2016   ALT 12 (L) 06/05/2016   ALKPHOS 61 06/05/2016   BILITOT 0.7 06/05/2016   GFRNONAA >60 06/05/2016   GFRAA >60 06/05/2016    Lab Results  Component Value Date   WBC 3.5 (L) 06/05/2016   NEUTROABS 2.0 06/05/2016   HGB 13.3 06/05/2016   HCT 39.3 (L) 06/05/2016   MCV 80.7 06/05/2016   PLT 181 06/05/2016     STUDIES: Ct Soft Tissue Neck W Contrast  Result Date: 06/05/2016 CLINICAL DATA:  Nodular sclerosing Hodgkin lymphoma EXAM: CT NECK WITH CONTRAST TECHNIQUE: Multidetector CT imaging of the neck was performed using the standard protocol following the bolus administration of intravenous contrast. CONTRAST:  80mL ISOVUE-300 IOPAMIDOL (ISOVUE-300) INJECTION 61% COMPARISON:  CT neck 11/29/2015, 10/06/2014 FINDINGS: Pharynx and larynx: Negative for mass or edema Salivary glands: Negative Thyroid: Negative Lymph nodes: No pathologic lymph nodes in the neck. Large mass in the right anterior lower neck has resolved. Vascular: Negative Limited intracranial: Negative Visualized orbits: Negative Mastoids and visualized paranasal sinuses: Mild mucosal edema in the paranasal sinuses without air-fluid level. Mastoid sinus clear bilaterally. Skeleton: Negative Upper chest: Chest CT from today reported separately Other: None IMPRESSION: No recurrent adenopathy in the neck. No pathologic nodes are mass identified. No change from the prior CT. Electronically Signed   By: Franchot Gallo M.D.   On: 06/05/2016 10:00   Ct Chest W Contrast  Result Date: 06/05/2016 CLINICAL DATA:  Subsequent treatment evaluation for Hodgkin's lymphoma. Patient status post chemo radiation. EXAM: CT CHEST WITH CONTRAST TECHNIQUE: Multidetector CT imaging of the chest was performed during intravenous contrast administration. CONTRAST:  3mL ISOVUE-300 IOPAMIDOL (ISOVUE-300) INJECTION 61% COMPARISON:  11/29/2015 FINDINGS: Cardiovascular: No axillary supraclavicular adenopathy. No mediastinal hilar  adenopathy. No pericardial fluid. No coronary calcifications. Mediastinum/Nodes: No axillary supraclavicular adenopathy. No mediastinal hilar adenopathy. Normal esophagus. Lungs/Pleura: No suspicious pulmonary nodularity. No pleural nodularity Upper Abdomen: Limited view of the liver, kidneys, pancreas are unremarkable. Normal adrenal glands. Musculoskeletal: No aggressive osseous lesion IMPRESSION: 1. No thoracic lymphadenopathy. 2. No evidence of lymphoma recurrence. Electronically Signed   By: Suzy Bouchard M.D.   On: 06/05/2016 10:49    ASSESSMENT: Stage II Hodgkin's disease.  PLAN:    1. Hodgkin's disease: Patient completed his chemotherapy with ABVD on April 18, 2016. His port has been removed.  CT scan results from June 05, 2016 reviewed independently and reported as above with no evidence of disease. Plan to do imaging with CT scan every 6 months for 2 years and then yearly thereafter. Patient will return to clinic in 6 months for repeat laboratory work and CT results.  2. Leukopenia: Mild, monitor.  3. Anemia: Resolved.  Patient expressed understanding and was in agreement with this plan. He also understands that He can call clinic at any time with any questions, concerns, or complaints.    Lloyd Huger, MD   06/15/2016 10:03 AM

## 2016-06-12 ENCOUNTER — Inpatient Hospital Stay (HOSPITAL_BASED_OUTPATIENT_CLINIC_OR_DEPARTMENT_OTHER): Payer: 59 | Admitting: Oncology

## 2016-06-12 VITALS — BP 115/70 | HR 73 | Temp 98.1°F | Resp 18 | Wt 318.3 lb

## 2016-06-12 DIAGNOSIS — C8111 Nodular sclerosis classical Hodgkin lymphoma, lymph nodes of head, face, and neck: Secondary | ICD-10-CM | POA: Diagnosis not present

## 2016-06-12 DIAGNOSIS — Z9221 Personal history of antineoplastic chemotherapy: Secondary | ICD-10-CM

## 2016-06-12 DIAGNOSIS — Z923 Personal history of irradiation: Secondary | ICD-10-CM

## 2016-06-12 DIAGNOSIS — D72819 Decreased white blood cell count, unspecified: Secondary | ICD-10-CM | POA: Diagnosis not present

## 2016-06-12 NOTE — Progress Notes (Signed)
Patient here today for CT results. 

## 2016-06-19 ENCOUNTER — Other Ambulatory Visit: Payer: Self-pay | Admitting: Nurse Practitioner

## 2016-12-11 ENCOUNTER — Ambulatory Visit
Admission: RE | Admit: 2016-12-11 | Discharge: 2016-12-11 | Disposition: A | Discharge status: Home / Self Care | Payer: 59 | Source: Ambulatory Visit | Attending: Oncology | Admitting: Oncology

## 2016-12-11 ENCOUNTER — Inpatient Hospital Stay: Payer: 59 | Attending: Oncology

## 2016-12-11 DIAGNOSIS — R590 Localized enlarged lymph nodes: Secondary | ICD-10-CM | POA: Diagnosis not present

## 2016-12-11 DIAGNOSIS — Z8571 Personal history of Hodgkin lymphoma: Secondary | ICD-10-CM | POA: Diagnosis present

## 2016-12-11 DIAGNOSIS — C8111 Nodular sclerosis classical Hodgkin lymphoma, lymph nodes of head, face, and neck: Secondary | ICD-10-CM | POA: Diagnosis present

## 2016-12-11 DIAGNOSIS — Z9221 Personal history of antineoplastic chemotherapy: Secondary | ICD-10-CM | POA: Insufficient documentation

## 2016-12-11 LAB — COMPREHENSIVE METABOLIC PANEL
ALK PHOS: 62 U/L (ref 38–126)
ALT: 12 U/L — AB (ref 17–63)
AST: 16 U/L (ref 15–41)
Albumin: 4.7 g/dL (ref 3.5–5.0)
Anion gap: 6 (ref 5–15)
BUN: 10 mg/dL (ref 6–20)
CHLORIDE: 105 mmol/L (ref 101–111)
CO2: 28 mmol/L (ref 22–32)
CREATININE: 1.1 mg/dL (ref 0.61–1.24)
Calcium: 9.8 mg/dL (ref 8.9–10.3)
GFR calc Af Amer: >60 mL/min (ref >60)
GFR calc non Af Amer: >60 mL/min (ref >60)
GLUCOSE: 98 mg/dL (ref 65–99)
Potassium: 4.3 mmol/L (ref 3.5–5.1)
SODIUM: 139 mmol/L (ref 135–145)
Total Bilirubin: 1.1 mg/dL (ref 0.3–1.2)
Total Protein: 8.2 g/dL — ABNORMAL HIGH (ref 6.5–8.1)

## 2016-12-11 LAB — CBC WITH DIFFERENTIAL/PLATELET
BASOS ABS: 0 10*3/uL (ref 0–0.1)
Basophils Relative: 1 %
EOS ABS: 0 10*3/uL (ref 0–0.7)
EOS PCT: 1 %
HCT: 42.8 % (ref 40.0–52.0)
HEMOGLOBIN: 14.9 g/dL (ref 13.0–18.0)
LYMPHS PCT: 26 %
Lymphs Abs: 1.1 10*3/uL (ref 1.0–3.6)
MCH: 28.3 pg (ref 26.0–34.0)
MCHC: 34.8 g/dL (ref 32.0–36.0)
MCV: 81.2 fL (ref 80.0–100.0)
Monocytes Absolute: 0.3 10*3/uL (ref 0.2–1.0)
Monocytes Relative: 8 %
NEUTROS PCT: 64 %
Neutro Abs: 2.7 10*3/uL (ref 1.4–6.5)
PLATELETS: 200 10*3/uL (ref 150–440)
RBC: 5.27 MIL/uL (ref 4.40–5.90)
RDW: 15.8 % — ABNORMAL HIGH (ref 11.5–14.5)
WBC: 4.2 10*3/uL (ref 3.8–10.6)

## 2016-12-11 LAB — LACTATE DEHYDROGENASE: LDH: 125 U/L (ref 98–192)

## 2016-12-11 MED ORDER — IOPAMIDOL (ISOVUE-300) INJECTION 61%
75.0000 mL | Freq: Once | INTRAVENOUS | Status: AC | PRN
  Administered 2016-12-11: 75 mL via INTRAVENOUS

## 2016-12-17 NOTE — Progress Notes (Signed)
N[facility]olas [facility]  [phone] [phone]  ID: Nicholas Ballard. OB: 26-Feb-1991  MR#: [id number]  AOZ#:[id number]  Patient Care Team: Nicholas Huger, MD as PCP - General (Oncology)  CHIEF COMPLAINT: Stage II Hodgkin's disease.  INTERVAL HISTORY: Patient returns to clinic today for laboratory work, discussion of his imaging results, and further evaluation.  He continues to feel well and is asymptomatic. He has no neurologic complaints. He denies any fevers, chills, night sweats, or weight loss. He denies any difficulty swallowing or dysphagia. He has no chest pain, shortness of breath, or cough. He denies any nausea, vomiting, constipation, or diarrhea. He has no urinary complaints. Patient offers no specific complaints today.   REVIEW OF SYSTEMS:   Review of Systems  Constitutional: Negative.  Negative for fever, malaise/fatigue and weight loss.  HENT: Negative.  Negative for sore throat.   Respiratory: Negative.  Negative for cough and shortness of breath.   Cardiovascular: Negative.  Negative for chest pain.  Gastrointestinal: Negative.  Negative for abdominal pain.  Genitourinary: Negative.   Musculoskeletal: Negative.   Neurological: Negative.  Negative for weakness.  Psychiatric/Behavioral: Negative.     As per HPI. Otherwise, a complete review of systems is negative.  PAST MEDICAL HISTORY: Past Medical History:  Diagnosis Date  . Cancer ([place])    Hodgkins Lymphoma  . Neck mass    right     PAST SURGICAL HISTORY: Past Surgical History:  Procedure Laterality Date  . PERIPHERAL VASCULAR CATHETERIZATION N/A 11/09/2014   Procedure: Nicholas Ballard Cath Insertion;  Surgeon: Nicholas Huxley, MD;  Location: [facility];  Service: Cardiovascular;  Laterality: N/A;  . PERIPHERAL VASCULAR CATHETERIZATION N/A 07/26/2015   Procedure: Nicholas Ballard Cath Removal;  Surgeon: Nicholas Huxley, MD;  Location: [facility];  Service: Cardiovascular;   Laterality: N/A;    FAMILY HISTORY Family History  Problem Relation Age of Onset  . Cancer Paternal Grandfather        head and neck cancer  . Diabetes Other   . Hypertension Other   . Cancer Other        history of prostate and kidney cancer       ADVANCED DIRECTIVES:    HEALTH MAINTENANCE: Social History  Substance Use Topics  . Smoking status: Never Smoker  . Smokeless tobacco: Never Used  . Alcohol use No     Comment: occasional     No Known Allergies  No current outpatient prescriptions on file.   No current facility-administered medications for this visit.     OBJECTIVE: Vitals:   12/18/16 1456  BP: 123/75  Pulse: 69  Resp: 20  Temp: 98.4 F (36.9 C)     Body mass index is 41.03 kg/m.    ECOG FS:0 - Asymptomatic  General: Well-developed, well-nourished, no acute distress. Eyes: anicteric sclera. HEENT: Right neck/supraclavicular mass is no longer palpable Lungs: Clear to auscultation bilaterally. Heart: Regular rate and rhythm. No rubs, murmurs, or gallops. Abdomen: Soft, nontender, nondistended. No organomegaly noted, normoactive bowel sounds. Musculoskeletal: No edema, cyanosis, or clubbing. Neuro: Alert, answering all questions appropriately. Cranial nerves grossly intact. Skin: No rashes or petechiae noted. Psych: Normal affect.    LAB RESULTS:  Lab Results  Component Value Date   NA 139 12/11/2016   K 4.3 12/11/2016   CL 105 12/11/2016   CO2 28 12/11/2016   GLUCOSE 98 12/11/2016   BUN 10 12/11/2016   CREATININE 1.10 12/11/2016   CALCIUM 9.8 12/11/2016  PROT 8.2 (H) 12/11/2016   ALBUMIN 4.7 12/11/2016   AST 16 12/11/2016   ALT 12 (L) 12/11/2016   ALKPHOS 62 12/11/2016   BILITOT 1.1 12/11/2016   GFRNONAA >60 12/11/2016   GFRAA >60 12/11/2016    Lab Results  Component Value Date   WBC 4.2 12/11/2016   NEUTROABS 2.7 12/11/2016   HGB 14.9 12/11/2016   HCT 42.8 12/11/2016   MCV 81.2 12/11/2016   PLT 200 12/11/2016      STUDIES: Ct Soft Tissue Neck W Contrast  Result Date: 12/11/2016 CLINICAL DATA:  Follow-up lymphoma.  No new complaints. EXAM: CT NECK WITH CONTRAST TECHNIQUE: Multidetector CT imaging of the neck was performed using the standard protocol following the bolus administration of intravenous contrast. CONTRAST:  Isovue 300, 75 mL. COMPARISON:  06/05/2016.  11/29/2015.  10/06/2014. FINDINGS: Pharynx and larynx: Normal. No mass or swelling. Salivary glands: No inflammation, mass, or stone. Thyroid: Normal. Lymph nodes: None enlarged or abnormal density. No findings suggestive of recurrence. Vascular: Negative. Limited intracranial: Negative. Visualized orbits: Negative. Mastoids and visualized paranasal sinuses: Clear. Skeleton: No acute or aggressive process. Upper chest: Reported separately. Other: Slight asymmetry of size, probable mild atrophy, RIGHT sternocleidomastoid and RIGHT strap muscles. IMPRESSION: No evidence of recurrent lymphoma in the neck. Electronically Signed   By: Nicholas Ballard M.D.   On: 12/11/2016 10:55   Ct Chest W Contrast  Result Date: 12/11/2016 CLINICAL DATA:  Followup lymphoma. EXAM: CT CHEST WITH CONTRAST TECHNIQUE: Multidetector CT imaging of the chest was performed during intravenous contrast administration. CONTRAST:  75 cc Isovue-300 COMPARISON:  Chest CT scans from 2017 and PET-CT from 2016 FINDINGS: Cardiovascular: The heart is normal in size. No pericardial effusion. Stable thoracic aorta. No aneurysm, dissection or calcification. No coronary artery calcifications. 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 Mediastinum/Nodes: Stable low-attenuation adenopathy in the upper mediastinum on the right side. This measures 16 mm on image number 50. It measured 26 mm on the prior study from 2016 and 19.5 mm on the study from 2017. Small paratracheal and precarinal lymph nodes are also stable. 8.5 mm lymph node on image number 46 is unchanged. No AP  window or prevascular lymph nodes. The main pulmonary artery is enlarged which could suggest pulmonary hypertension. The esophagus is grossly normal. Lungs/Pleura: The lungs are clear. No pulmonary nodules or pleural effusion. Upper Abdomen: The upper abdomen is unremarkable. No upper abdominal lymphadenopathy. Chest wall/ Musculoskeletal: No chest wall lesions. No supraclavicular or axillary lymphadenopathy. Small scattered lymph nodes are noted. IMPRESSION: 1. No recurrent right supraclavicular lymphadenopathy. 2. Right upper pericardial lymph node continues to regress. Small right paratracheal and precarinal nodes or stable. No new or progressive adenopathy in the mediastinum or hilar regions. 3. 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 4. No pulmonary findings or upper abdominal findings. Electronically Signed   By: Marijo Sanes M.D.   On: 12/11/2016 17:11    ASSESSMENT: Stage II Hodgkin's disease.  PLAN:    1. Hodgkin's disease: Patient completed his chemotherapy with ABVD on April 18, 2016. His port has been removed.  CT scan results from June 2018 reviewed independently and reported as above with no evidence of disease. Plan to do imaging with CT scan every 6 months for 2 years and then yearly thereafter. Patient will return to clinic in 6 months for repeat laboratory work, further evaluation, and CT results.   Approximately 20 minutes was spent in discussion of which  greater than 50% was consultation.  Patient expressed understanding and was in agreement with this plan. He also understands that He can call clinic at any time with any questions, concerns, or complaints.   Faythe Casa, NP  Patient was seen and evaluated independently and I agree with the assessment and plan as dictated above.  Nicholas Huger, MD 12/21/16 8:49 PM

## 2016-12-18 ENCOUNTER — Inpatient Hospital Stay (HOSPITAL_BASED_OUTPATIENT_CLINIC_OR_DEPARTMENT_OTHER): Payer: 59 | Admitting: Oncology

## 2016-12-18 VITALS — BP 123/75 | HR 69 | Temp 98.4°F | Resp 20 | Wt 311.0 lb

## 2016-12-18 DIAGNOSIS — Z9221 Personal history of antineoplastic chemotherapy: Secondary | ICD-10-CM

## 2016-12-18 DIAGNOSIS — Z8571 Personal history of Hodgkin lymphoma: Secondary | ICD-10-CM

## 2016-12-18 DIAGNOSIS — C8111 Nodular sclerosis classical Hodgkin lymphoma, lymph nodes of head, face, and neck: Secondary | ICD-10-CM

## 2016-12-18 NOTE — Progress Notes (Signed)
Patient denies any concerns today.  

## 2017-03-03 IMAGING — CT CT NECK WITH CONTRAST
2 of 3 series · 7 of 14 positions shown, 8 images · IV contrast (omnipaque)
Comparison: Radiography 08/24/2014

CLINICAL DATA: Large mass in the right neck/upper chest.

EXAM:
CT NECK, CHEST WITH CONTRAST
TECHNIQUE: Multidetector CT imaging of the neck, chest was performed following
the standard protocol during bolus administration of intravenous
contrast.
CONTRAST:  75 cc Omnipaque 300

[Series 2: axial neck · axial · 0.75mm/px · z∈[-522,-358]mm · 3 of 164 slices shown]
[im 41/164  bone]
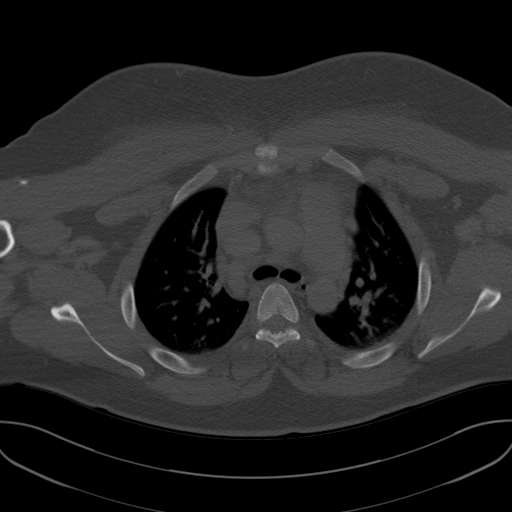
[im 82/164  bone]
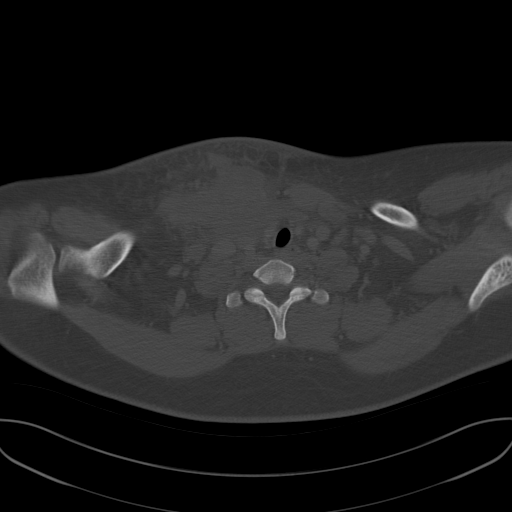
[im 123/164  bone]
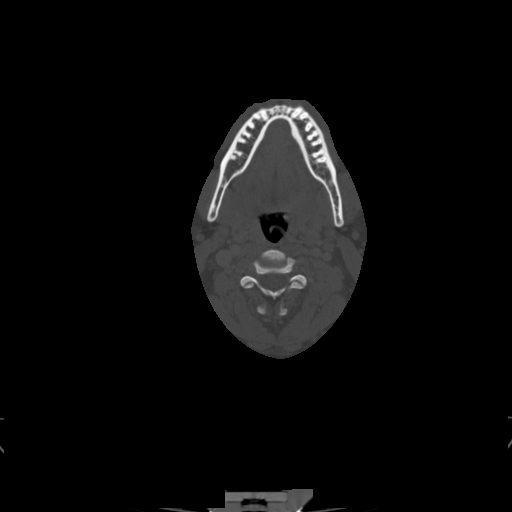

[Series 7: ax oropharynx · axial · 0.53mm/px · z∈[-554,-357]mm · 4 of 165 slices shown, 5 images]
[im 33/165  soft-tissue]
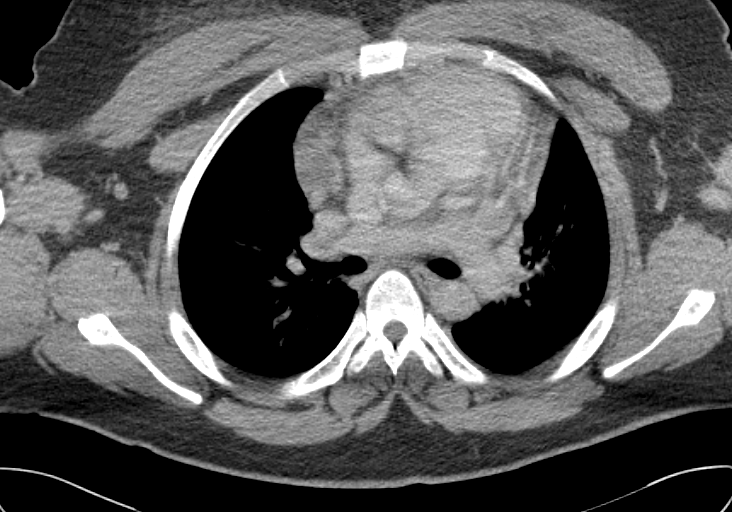
[im 33/165  bone]
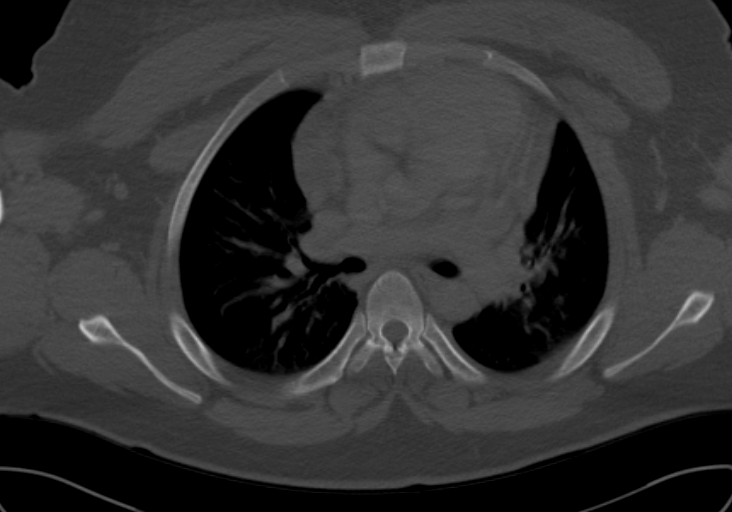
[im 66/165  bone]
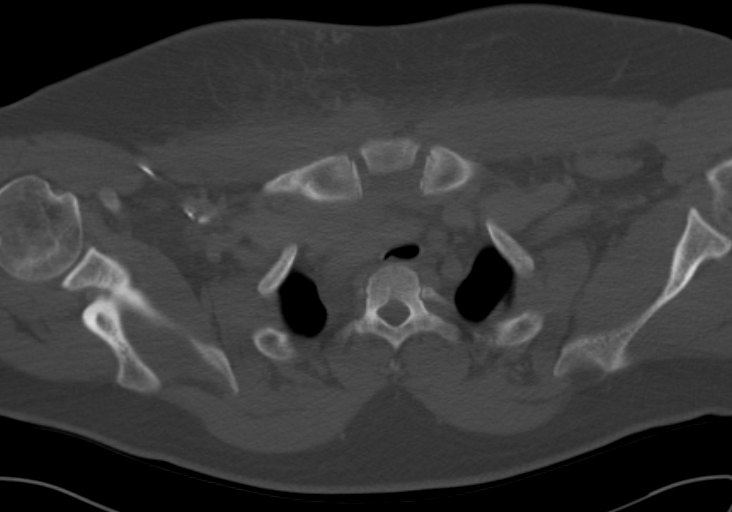
[im 99/165  bone]
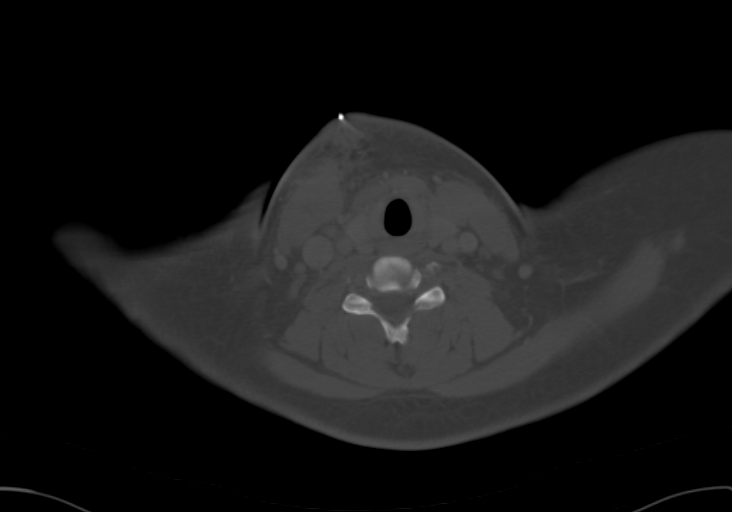
[im 132/165  bone]
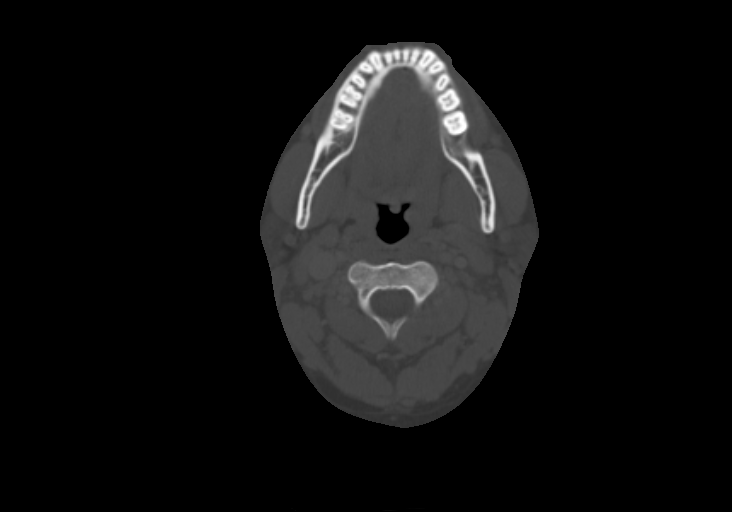

[7 of 14 positions shown; findings below may reference images not displayed]

FINDINGS: CT NECK FINDINGS

Pharynx and larynx: Normal

Salivary glands: Parotid and submandibular glands are normal

Thyroid: Normal

Lymph nodes: No abnormality in the upper neck on the left. Abnormal
supraclavicular nodes on the left measuring up to 13 mm in diameter.
On the right, no abnormal nodes are seen in the upper neck at levels
1 or 2. There is a level 3 node measuring 12 mm in diameter. Below
that, there is massive lymphadenopathy at level 4 and 6. There are
multiple supraclavicular lymph nodes, the largest measuring 24 mm in
diameter. There is a very large mass at the junction of the right
neck and chest measuring 7 cm in diameter. The overlying soft
tissues appear edematous. Mass extends into the upper mediastinum as
described below. There is swelling and probable involvement of the
right sternocleidomastoid muscle. The right internal jugular vein
does not appear thrombosed in the neck.

Vascular: Arterial and venous structures patent in the neck.

Limited intracranial: Normal

Visualized orbits: Normal

Mastoids and visualized paranasal sinuses: Clear

Skeleton: No significant finding

CT CHEST FINDINGS

Mediastinum/Nodes: 7 cm in diameter mass at the low right neck
extending into the upper mediastinum is again demonstrated. Below
that, there are multiple enlarged lymph nodes in the pericardiac
region on the right and in the right paratracheal region. Largest
right paratracheal node is 3.2 cm. Largest pericardial node on the
right is 2.8 cm. Nodal mass causes extrinsic compression of the
right subclavian vein and upper SVC. Complete obstruction does not
appear to be present. No sign of thrombosis at this time.

Lungs/Pleura: Lungs clear.  No pleural tumor or pleural fluid.

Musculoskeletal: No spinal or rib abnormality. No apparent
involvement of the clavicle or sternum.

Scans in the upper abdomen are normal.
IMPRESSION: Large mass at the junction of the right neck and upper chest
measuring at least 7 cm in diameter. Extensive lymphadenopathy in
the supraclavicular region on the right and in the mediastinum.
Smaller lymph nodes in the left supraclavicular region. Extensive
mediastinal lymphadenopathy. The overall picture is quite likely to
represent Hodgkin's lymphoma. Other forms of lymphoma are possible.
Aggressive fungal infection can present with this picture but would
be unusual.

Nodal mass causes extrinsic compression upon the right
supraclavicular rib vein and upper SVC. Complete venous obstruction
does not appear to be present. No visible thrombosis.

These results were called by telephone at the time of interpretation
on 10/06/2014 at [DATE] to Dr. VEERA KHALIL , who verbally
acknowledged these results.

## 2017-06-26 ENCOUNTER — Other Ambulatory Visit: Payer: Self-pay

## 2017-06-26 DIAGNOSIS — C8111 Nodular sclerosis classical Hodgkin lymphoma, lymph nodes of head, face, and neck: Secondary | ICD-10-CM

## 2017-06-30 ENCOUNTER — Ambulatory Visit: Admission: RE | Admit: 2017-06-30 | Payer: Self-pay | Source: Ambulatory Visit

## 2017-07-01 NOTE — Progress Notes (Deleted)
[facility]  Telephone:([phone] [phone]  ID: Nicholas Ballard. OB: 11-30-1990  MR#: [id number]  RSW#:[id number]  Patient Care Team: Lloyd Huger, MD as PCP - General (Oncology)  CHIEF COMPLAINT: Stage II Hodgkin's disease.  INTERVAL HISTORY: Patient returns to clinic today for laboratory work, discussion of his imaging results, and further evaluation.  He continues to feel well and is asymptomatic. He has no neurologic complaints. He denies any fevers, chills, night sweats, or weight loss. He denies any difficulty swallowing or dysphagia. He has no chest pain, shortness of breath, or cough. He denies any nausea, vomiting, constipation, or diarrhea. He has no urinary complaints. Patient offers no specific complaints today.   REVIEW OF SYSTEMS:   Review of Systems  Constitutional: Negative.  Negative for fever, malaise/fatigue and weight loss.  HENT: Negative.  Negative for sore throat.   Respiratory: Negative.  Negative for cough and shortness of breath.   Cardiovascular: Negative.  Negative for chest pain.  Gastrointestinal: Negative.  Negative for abdominal pain.  Genitourinary: Negative.   Musculoskeletal: Negative.   Neurological: Negative.  Negative for weakness.  Psychiatric/Behavioral: Negative.     As per HPI. Otherwise, a complete review of systems is negative.  PAST MEDICAL HISTORY: Past Medical History:  Diagnosis Date  . Cancer ([place])    Hodgkins Lymphoma  . Neck mass    right     PAST SURGICAL HISTORY: Past Surgical History:  Procedure Laterality Date  . PERIPHERAL VASCULAR CATHETERIZATION N/A 11/09/2014   Procedure: Glori Luis Cath Insertion;  Surgeon: Algernon Huxley, MD;  Location: [facility];  Service: Cardiovascular;  Laterality: N/A;  . PERIPHERAL VASCULAR CATHETERIZATION N/A 07/26/2015   Procedure: Glori Luis Cath Removal;  Surgeon: Algernon Huxley, MD;  Location: [facility];  Service: Cardiovascular;   Laterality: N/A;    FAMILY HISTORY Family History  Problem Relation Age of Onset  . Cancer Paternal Grandfather        head and neck cancer  . Diabetes Other   . Hypertension Other   . Cancer Other        history of prostate and kidney cancer       ADVANCED DIRECTIVES:    HEALTH MAINTENANCE: Social History   Tobacco Use  . Smoking status: Never Smoker  . Smokeless tobacco: Never Used  Substance Use Topics  . Alcohol use: No    Comment: occasional  . Drug use: No     No Known Allergies  No current outpatient medications on file.   No current facility-administered medications for this visit.     OBJECTIVE: There were no vitals filed for this visit.   There is no height or weight on file to calculate BMI.    ECOG FS:0 - Asymptomatic  General: Well-developed, well-nourished, no acute distress. Eyes: anicteric sclera. HEENT: Right neck/supraclavicular mass is no longer palpable Lungs: Clear to auscultation bilaterally. Heart: Regular rate and rhythm. No rubs, murmurs, or gallops. Abdomen: Soft, nontender, nondistended. No organomegaly noted, normoactive bowel sounds. Musculoskeletal: No edema, cyanosis, or clubbing. Neuro: Alert, answering all questions appropriately. Cranial nerves grossly intact. Skin: No rashes or petechiae noted. Psych: Normal affect.    LAB RESULTS:  Lab Results  Component Value Date   NA 139 12/11/2016   K 4.3 12/11/2016   CL 105 12/11/2016   CO2 28 12/11/2016   GLUCOSE 98 12/11/2016   BUN 10 12/11/2016   CREATININE 1.10 12/11/2016   CALCIUM 9.8 12/11/2016  PROT 8.2 (H) 12/11/2016   ALBUMIN 4.7 12/11/2016   AST 16 12/11/2016   ALT 12 (L) 12/11/2016   ALKPHOS 62 12/11/2016   BILITOT 1.1 12/11/2016   GFRNONAA >60 12/11/2016   GFRAA >60 12/11/2016    Lab Results  Component Value Date   WBC 4.2 12/11/2016   NEUTROABS 2.7 12/11/2016   HGB 14.9 12/11/2016   HCT 42.8 12/11/2016   MCV 81.2 12/11/2016   PLT 200 12/11/2016      STUDIES: No results found.  ASSESSMENT: Stage II Hodgkin's disease.  PLAN:    1. Hodgkin's disease: Patient completed his chemotherapy with ABVD on April 18, 2016. His port has been removed.  CT scan results from June 05, 2016 reviewed independently and reported as above with no evidence of disease. Plan to do imaging with CT scan every 6 months for 2 years and then yearly thereafter. Patient will return to clinic in 6 months for repeat laboratory work and CT results.  2. Leukopenia: Mild, monitor.  3. Anemia: Resolved.  Patient expressed understanding and was in agreement with this plan. He also understands that He can call clinic at any time with any questions, concerns, or complaints.    Lloyd Huger, MD   07/01/2017 11:02 PM

## 2017-07-02 ENCOUNTER — Inpatient Hospital Stay: Payer: 59 | Admitting: Oncology

## 2017-07-02 ENCOUNTER — Inpatient Hospital Stay: Payer: 59

## 2017-08-16 NOTE — Progress Notes (Deleted)
[facility]  Telephone:([phone] [phone]  ID: Nicholas Ballard. OB: [birth year]  MR#: [id number]  DJM#:[id number]  Patient Care Team: Lloyd Huger, MD as PCP - General (Oncology)  CHIEF COMPLAINT: Stage II Hodgkin's disease.  INTERVAL HISTORY: Patient returns to clinic today for laboratory work, discussion of his imaging results, and further evaluation.  He continues to feel well and is asymptomatic. He has no neurologic complaints. He denies any fevers, chills, night sweats, or weight loss. He denies any difficulty swallowing or dysphagia. He has no chest pain, shortness of breath, or cough. He denies any nausea, vomiting, constipation, or diarrhea. He has no urinary complaints. Patient offers no specific complaints today.   REVIEW OF SYSTEMS:   Review of Systems  Constitutional: Negative.  Negative for fever, malaise/fatigue and weight loss.  HENT: Negative.  Negative for sore throat.   Respiratory: Negative.  Negative for cough and shortness of breath.   Cardiovascular: Negative.  Negative for chest pain.  Gastrointestinal: Negative.  Negative for abdominal pain.  Genitourinary: Negative.   Musculoskeletal: Negative.   Neurological: Negative.  Negative for weakness.  Psychiatric/Behavioral: Negative.     As per HPI. Otherwise, a complete review of systems is negative.  PAST MEDICAL HISTORY: Past Medical History:  Diagnosis Date  . Cancer ([place])    Hodgkins Lymphoma  . Neck mass    right     PAST SURGICAL HISTORY: Past Surgical History:  Procedure Laterality Date  . PERIPHERAL VASCULAR CATHETERIZATION N/A 11/09/2014   Procedure: Glori Luis Cath Insertion;  Surgeon: Algernon Huxley, MD;  Location: [facility];  Service: Cardiovascular;  Laterality: N/A;  . PERIPHERAL VASCULAR CATHETERIZATION N/A 07/26/2015   Procedure: Glori Luis Cath Removal;  Surgeon: Algernon Huxley, MD;  Location: [facility];  Service: Cardiovascular;   Laterality: N/A;    FAMILY HISTORY Family History  Problem Relation Age of Onset  . Cancer Paternal Grandfather        head and neck cancer  . Diabetes Other   . Hypertension Other   . Cancer Other        history of prostate and kidney cancer       ADVANCED DIRECTIVES:    HEALTH MAINTENANCE: Social History   Tobacco Use  . Smoking status: Never Smoker  . Smokeless tobacco: Never Used  Substance Use Topics  . Alcohol use: No    Comment: occasional  . Drug use: No     No Known Allergies  No current outpatient medications on file.   No current facility-administered medications for this visit.     OBJECTIVE: There were no vitals filed for this visit.   There is no height or weight on file to calculate BMI.    ECOG FS:0 - Asymptomatic  General: Well-developed, well-nourished, no acute distress. Eyes: anicteric sclera. HEENT: Right neck/supraclavicular mass is no longer palpable Lungs: Clear to auscultation bilaterally. Heart: Regular rate and rhythm. No rubs, murmurs, or gallops. Abdomen: Soft, nontender, nondistended. No organomegaly noted, normoactive bowel sounds. Musculoskeletal: No edema, cyanosis, or clubbing. Neuro: Alert, answering all questions appropriately. Cranial nerves grossly intact. Skin: No rashes or petechiae noted. Psych: Normal affect.    LAB RESULTS:  Lab Results  Component Value Date   NA 139 12/11/2016   K 4.3 12/11/2016   CL 105 12/11/2016   CO2 28 12/11/2016   GLUCOSE 98 12/11/2016   BUN 10 12/11/2016   CREATININE 1.10 12/11/2016   CALCIUM 9.8 12/11/2016  PROT 8.2 (H) 12/11/2016   ALBUMIN 4.7 12/11/2016   AST 16 12/11/2016   ALT 12 (L) 12/11/2016   ALKPHOS 62 12/11/2016   BILITOT 1.1 12/11/2016   GFRNONAA >60 12/11/2016   GFRAA >60 12/11/2016    Lab Results  Component Value Date   WBC 4.2 12/11/2016   NEUTROABS 2.7 12/11/2016   HGB 14.9 12/11/2016   HCT 42.8 12/11/2016   MCV 81.2 12/11/2016   PLT 200 12/11/2016      STUDIES: No results found.  ASSESSMENT: Stage II Hodgkin's disease.  PLAN:    1. Hodgkin's disease: Patient completed his chemotherapy with ABVD on April 18, 2016. His port has been removed.  CT scan results from June 05, 2016 reviewed independently and reported as above with no evidence of disease. Plan to do imaging with CT scan every 6 months for 2 years and then yearly thereafter. Patient will return to clinic in 6 months for repeat laboratory work and CT results.  2. Leukopenia: Mild, monitor.  3. Anemia: Resolved.  Patient expressed understanding and was in agreement with this plan. He also understands that He can call clinic at any time with any questions, concerns, or complaints.    Lloyd Huger, MD   08/16/2017 9:29 AM

## 2017-08-18 ENCOUNTER — Inpatient Hospital Stay: Payer: 59 | Admitting: Oncology

## 2017-08-18 ENCOUNTER — Inpatient Hospital Stay: Payer: 59

## 2018-02-24 ENCOUNTER — Ambulatory Visit (INDEPENDENT_AMBULATORY_CARE_PROVIDER_SITE_OTHER): Payer: 59 | Admitting: Family Medicine

## 2018-02-24 ENCOUNTER — Encounter: Payer: Self-pay | Admitting: Family Medicine

## 2018-02-24 VITALS — BP 121/77 | HR 74 | Temp 98.4°F | Resp 16 | Wt 295.0 lb

## 2018-02-24 DIAGNOSIS — R51 Headache: Secondary | ICD-10-CM | POA: Diagnosis not present

## 2018-02-24 DIAGNOSIS — R519 Headache, unspecified: Secondary | ICD-10-CM

## 2018-02-24 MED ORDER — FLUTICASONE PROPIONATE 50 MCG/ACT NA SUSP: 2.0000 | Freq: Every day | NASAL | 6 refills | Status: DC

## 2018-02-24 NOTE — Patient Instructions (Addendum)
   Go ahead and start the fluticasone sinus spray prescription that was sent to [facility]. If your headaches are not much better within a week then go to [facility] to get sinus X-rays.

## 2018-02-24 NOTE — Progress Notes (Signed)
Patient: [facility]. Male    DOB: [birth year]   27 y.o.   MRN: [id number] Visit Date: 02/24/2018  Today's Provider: Lelon Huh, MD   Chief Complaint  Patient presents with  . Headache   Subjective:    Headache   This is a new problem. The current episode started 1 to 4 weeks ago (2 weeks). The problem has been unchanged. The pain is located in the bilateral and temporal region. The quality of the pain is described as band-like. The pain is at a severity of 7/10. The pain is moderate. Associated symptoms include anorexia, blurred vision, eye watering, neck pain, phonophobia, photophobia, sinus pressure and a visual change. Pertinent negatives include no abdominal pain, abnormal behavior, back pain, coughing, dizziness, drainage, ear pain, eye pain, eye redness, facial sweating, fever, hearing loss, insomnia, loss of balance, muscle aches, nausea, numbness, rhinorrhea, scalp tenderness, seizures, sore throat, swollen glands, tingling, tinnitus, vomiting, weakness or weight loss. Nothing aggravates the symptoms. He has tried acetaminophen (Tylenol and Advil) for the symptoms. The treatment provided mild relief. There is no history of cluster headaches or migraine headaches.    Patient states he has been having sudden onset headaches for 2 weeks. Patient states headaches are bilateral and temporal. Patient describes pain as band-like and can last up to 1 hour at a time. Patient states he has had slight phonophobia and photophobia. Patient has been taking otc Tylenol and Advil with mild relief.   No Known Allergies  No current outpatient medications on file.  Review of Systems  Constitutional: Negative for appetite change, chills, fever and weight loss.  HENT: Positive for sinus pressure. Negative for ear pain, hearing loss, rhinorrhea, sore throat and tinnitus.   Eyes: Positive for blurred vision and photophobia. Negative for pain and redness.  Respiratory: Negative for  cough, chest tightness, shortness of breath and wheezing.   Cardiovascular: Negative for chest pain and palpitations.  Gastrointestinal: Positive for anorexia. Negative for abdominal pain, nausea and vomiting.  Musculoskeletal: Positive for neck pain. Negative for back pain.  Neurological: Positive for headaches. Negative for dizziness, tingling, seizures, weakness, numbness and loss of balance.  Psychiatric/Behavioral: The patient does not have insomnia.     Social History   Tobacco Use  . Smoking status: Never Smoker  . Smokeless tobacco: Never Used  Substance Use Topics  . Alcohol use: No    Comment: occasional   Objective:   BP 121/77 (BP Location: Right Arm, Patient Position: Sitting, Cuff Size: Large)   Pulse 74   Temp 98.4 F (36.9 C) (Oral)   Resp 16   Wt 295 lb (133.8 kg)   SpO2 98%   BMI 38.92 kg/m  Vitals:   02/24/18 1614  BP: 121/77  Pulse: 74  Resp: 16  Temp: 98.4 F (36.9 C)  TempSrc: Oral  SpO2: 98%  Weight: 295 lb (133.8 kg)     Physical Exam  General Appearance:    Alert, cooperative, no distress  HENT:   bilateral TM normal without fluid or infection, neck without nodes, throat normal without erythema or exudate, sinuses nontender and nasal mucosa pale and congested  Eyes:    PERRL, conjunctiva/corneas clear, EOM's intact       Lungs:     Clear to auscultation bilaterally, respirations unlabored  Heart:    Regular rate and rhythm  Neurologic:   Awake, alert, oriented x 3. No apparent focal neurological  defect.         Visual Acuity Screening   Right eye Left eye Both eyes  Without correction: 20/20 20/20 20/20   With correction:     Comments: Patient saw all colors      Assessment & Plan:     1. Sinus headache  - fluticasone (FLONASE) 50 MCG/ACT nasal spray; Place 2 sprays into both nostrils daily.  Dispense: 16 g; Refill: 6  If not improving on steroid nasal spray within a week, then will get sinus xrays.  - DG Sinuses  Complete; Future       Lelon Huh, MD  [facility]

## 2019-07-21 ENCOUNTER — Ambulatory Visit: Payer: BC Managed Care – PPO | Attending: Internal Medicine

## 2019-07-21 DIAGNOSIS — Z20822 Contact with and (suspected) exposure to covid-19: Secondary | ICD-10-CM

## 2019-07-21 DIAGNOSIS — U071 COVID-19: Secondary | ICD-10-CM | POA: Insufficient documentation

## 2019-07-22 LAB — NOVEL CORONAVIRUS, NAA: SARS-CoV-2, NAA: DETECTED — AB

## 2019-07-22 NOTE — Progress Notes (Signed)
Your test for COVID-19 was positive ("detected"), meaning that you were infected with the novel coronavirus and could give the germ to others.    Please continue isolation at home, for at least 10 days since the start of your fever/cough/breathlessness and until you have had 24 hours without fever (without taking a fever reducer) and with any cough/breathlessness improving. Use over-the-counter medications for symptoms.  If you have had no symptoms, but were exposed to someone who was positive for COVID-19, you will need to quarantine and self-isolate for 14 days from the date of exposure.    Please continue good preventive care measures, including:  frequent hand-washing, avoid touching your face, cover coughs/sneezes, stay out of crowds and keep a 6 foot distance from others.  Clean hard surfaces touched frequently with disinfectant cleaning products.   Please check in with your primary care provider about your positive test result.  Go to the nearest urgent care or ED for assessment if you have severe breathlessness or severe weakness/fatigue (ex needing new help getting out of bed or to the bathroom).  Members of your household will also need to quarantine for 14 days from the date of your positive test. You may be contacted to discuss possible treatment options, and you may also be contacted by the health department for follow up. Please call [facility] at [phone] if you have any questions or concerns.

## 2019-07-23 ENCOUNTER — Telehealth: Payer: Self-pay | Admitting: Adult Health

## 2019-07-23 NOTE — Telephone Encounter (Signed)
Called patient to evaluate how he is feeling since undergoing COVID testing on 07/21/2019.  He notes that he is asymptomatic.  Since he is asymptomatic, he would not meet criteria for COVID 19 monoclonal antibody therapy.    Wilber Bihari, NP

## 2020-06-05 ENCOUNTER — Encounter: Payer: Self-pay | Admitting: Emergency Medicine

## 2020-06-05 ENCOUNTER — Emergency Department
Admission: EM | Admit: 2020-06-05 | Discharge: 2020-06-05 | Disposition: A | Discharge status: Home / Self Care | Payer: BC Managed Care – PPO | Attending: Emergency Medicine | Admitting: Emergency Medicine

## 2020-06-05 ENCOUNTER — Other Ambulatory Visit: Payer: Self-pay

## 2020-06-05 ENCOUNTER — Emergency Department: Payer: BC Managed Care – PPO

## 2020-06-05 DIAGNOSIS — S8991XA Unspecified injury of right lower leg, initial encounter: Secondary | ICD-10-CM | POA: Diagnosis present

## 2020-06-05 DIAGNOSIS — Z8571 Personal history of Hodgkin lymphoma: Secondary | ICD-10-CM | POA: Diagnosis not present

## 2020-06-05 DIAGNOSIS — S8002XA Contusion of left knee, initial encounter: Secondary | ICD-10-CM | POA: Diagnosis not present

## 2020-06-05 DIAGNOSIS — M7918 Myalgia, other site: Secondary | ICD-10-CM

## 2020-06-05 DIAGNOSIS — Y92410 Unspecified street and highway as the place of occurrence of the external cause: Secondary | ICD-10-CM | POA: Diagnosis not present

## 2020-06-05 MED ORDER — ORPHENADRINE CITRATE ER 100 MG PO TB12: 100.0000 mg | ORAL_TABLET | Freq: Two times a day (BID) | ORAL | 0 refills | Status: DC

## 2020-06-05 MED ORDER — NAPROXEN 500 MG PO TABS: 500.0000 mg | ORAL_TABLET | Freq: Two times a day (BID) | ORAL | Status: DC

## 2020-06-05 NOTE — Discharge Instructions (Signed)
Follow discharge care instructions take medication as directed.
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
## 2021-02-06 ENCOUNTER — Ambulatory Visit (INDEPENDENT_AMBULATORY_CARE_PROVIDER_SITE_OTHER): Payer: BC Managed Care – PPO

## 2021-02-06 ENCOUNTER — Other Ambulatory Visit: Payer: Self-pay

## 2021-02-06 ENCOUNTER — Encounter: Payer: Self-pay | Admitting: Podiatry

## 2021-02-06 ENCOUNTER — Ambulatory Visit (INDEPENDENT_AMBULATORY_CARE_PROVIDER_SITE_OTHER): Payer: BC Managed Care – PPO | Admitting: Podiatry

## 2021-02-06 ENCOUNTER — Ambulatory Visit: Payer: BC Managed Care – PPO

## 2021-02-06 DIAGNOSIS — E785 Hyperlipidemia, unspecified: Secondary | ICD-10-CM | POA: Insufficient documentation

## 2021-02-06 DIAGNOSIS — Z0000 Encounter for general adult medical examination without abnormal findings: Secondary | ICD-10-CM | POA: Insufficient documentation

## 2021-02-06 DIAGNOSIS — M7752 Other enthesopathy of left foot: Secondary | ICD-10-CM

## 2021-02-06 DIAGNOSIS — D509 Iron deficiency anemia, unspecified: Secondary | ICD-10-CM | POA: Insufficient documentation

## 2021-02-06 DIAGNOSIS — M7750 Other enthesopathy of unspecified foot: Secondary | ICD-10-CM

## 2021-02-06 DIAGNOSIS — M216X2 Other acquired deformities of left foot: Secondary | ICD-10-CM

## 2021-02-06 DIAGNOSIS — M722 Plantar fascial fibromatosis: Secondary | ICD-10-CM

## 2021-02-06 DIAGNOSIS — M21862 Other specified acquired deformities of left lower leg: Secondary | ICD-10-CM

## 2021-02-06 DIAGNOSIS — R221 Localized swelling, mass and lump, neck: Secondary | ICD-10-CM | POA: Insufficient documentation

## 2021-02-06 DIAGNOSIS — R6889 Other general symptoms and signs: Secondary | ICD-10-CM | POA: Insufficient documentation

## 2021-02-06 DIAGNOSIS — Z23 Encounter for immunization: Secondary | ICD-10-CM | POA: Insufficient documentation

## 2021-02-06 MED ORDER — MELOXICAM 15 MG PO TABS: 15.0000 mg | ORAL_TABLET | Freq: Every day | ORAL | 3 refills | Status: DC

## 2021-02-06 NOTE — Patient Instructions (Signed)

## 2021-02-11 NOTE — Progress Notes (Signed)
  Subjective:  Patient ID: Nicholas Ballard., male    DOB: 06-11-1991,  MRN: [id number]  Chief Complaint  Patient presents with   Foot Pain       pain in left foot     30 y.o. male presents with the above complaint. History confirmed with patient.  Has been going on for about 2 months at the bottom of the heel.  Objective:  Physical Exam: warm, good capillary refill, no trophic changes or ulcerative lesions, normal DP and PT pulses, and normal sensory exam. Left Foot: point tenderness over the heel pad and point tenderness of the mid plantar fascia    Radiographs: Multiple views x-ray of the left foot: no fracture, dislocation, swelling or degenerative changes noted Assessment:   1. Plantar fasciitis, left   2. Gastrocnemius equinus of left lower extremity      Plan:  Patient was evaluated and treated and all questions answered.  Discussed the etiology and treatment options for plantar fasciitis including stretching, formal physical therapy, supportive shoegears such as a running shoe or sneaker, pre fabricated orthoses, injection therapy, and oral medications. We also discussed the role of surgical treatment of this for patients who do not improve after exhausting non-surgical treatment options.   -XR reviewed with patient -Educated patient on stretching and icing of the affected limb -Night splint dispensed -Plantar fascial brace dispensed -Injection delivered to the plantar fascia of the left foot. -Rx for meloxicam. Educated on use, risks and benefits of the medication  After sterile prep with povidone-iodine solution and alcohol, the left heel was injected with 0.5cc 2% xylocaine plain, 0.5cc 0.5% marcaine plain, '5mg'$  triamcinolone acetonide, and '2mg'$  dexamethasone was injected along the medial plantar fascia at the insertion on the plantar calcaneus. The patient tolerated the procedure well without complication.  Return in about 5 weeks (around 03/13/2021) for  recheck plantar fasciitis.

## 2021-03-13 ENCOUNTER — Ambulatory Visit: Payer: BC Managed Care – PPO | Admitting: Podiatry

## 2021-11-17 ENCOUNTER — Other Ambulatory Visit: Payer: Self-pay

## 2021-11-17 ENCOUNTER — Encounter (HOSPITAL_COMMUNITY): Payer: Self-pay | Admitting: *Deleted

## 2021-11-17 ENCOUNTER — Emergency Department (HOSPITAL_COMMUNITY)
Admission: EM | Admit: 2021-11-17 | Discharge: 2021-11-17 | Disposition: A | Discharge status: Home / Self Care | Payer: 59 | Attending: Emergency Medicine | Admitting: Emergency Medicine

## 2021-11-17 DIAGNOSIS — R1110 Vomiting, unspecified: Secondary | ICD-10-CM | POA: Diagnosis present

## 2021-11-17 DIAGNOSIS — K529 Noninfective gastroenteritis and colitis, unspecified: Secondary | ICD-10-CM | POA: Insufficient documentation

## 2021-11-17 LAB — CBC WITH DIFFERENTIAL/PLATELET
Abs Immature Granulocytes: 0.02 10*3/uL (ref 0.00–0.07)
Basophils Absolute: 0 10*3/uL (ref 0.0–0.1)
Basophils Relative: 0 %
Eosinophils Absolute: 0 10*3/uL (ref 0.0–0.5)
Eosinophils Relative: 0 %
HCT: 42.8 % (ref 39.0–52.0)
Hemoglobin: 14.5 g/dL (ref 13.0–17.0)
Immature Granulocytes: 0 %
Lymphocytes Relative: 7 %
Lymphs Abs: 0.4 10*3/uL — ABNORMAL LOW (ref 0.7–4.0)
MCH: 27.4 pg (ref 26.0–34.0)
MCHC: 33.9 g/dL (ref 30.0–36.0)
MCV: 80.9 fL (ref 80.0–100.0)
Monocytes Absolute: 0.5 10*3/uL (ref 0.1–1.0)
Monocytes Relative: 8 %
Neutro Abs: 5.2 10*3/uL (ref 1.7–7.7)
Neutrophils Relative %: 85 %
Platelets: 197 10*3/uL (ref 150–400)
RBC: 5.29 MIL/uL (ref 4.22–5.81)
RDW: 14.2 % (ref 11.5–15.5)
WBC: 6 10*3/uL (ref 4.0–10.5)
nRBC: 0 % (ref 0.0–0.2)

## 2021-11-17 LAB — I-STAT CHEM 8, ED
BUN: 11 mg/dL (ref 6–20)
Calcium, Ion: 1.16 mmol/L (ref 1.15–1.40)
Chloride: 101 mmol/L (ref 98–111)
Creatinine, Ser: 1.1 mg/dL (ref 0.61–1.24)
Glucose, Bld: 117 mg/dL — ABNORMAL HIGH (ref 70–99)
HCT: 43 % (ref 39.0–52.0)
Hemoglobin: 14.6 g/dL (ref 13.0–17.0)
Potassium: 3.4 mmol/L — ABNORMAL LOW (ref 3.5–5.1)
Sodium: 140 mmol/L (ref 135–145)
TCO2: 27 mmol/L (ref 22–32)

## 2021-11-17 LAB — URINALYSIS, ROUTINE W REFLEX MICROSCOPIC
Bacteria, UA: NONE SEEN
Bilirubin Urine: NEGATIVE
Glucose, UA: NEGATIVE mg/dL
Hgb urine dipstick: NEGATIVE
Ketones, ur: NEGATIVE mg/dL
Leukocytes,Ua: NEGATIVE
Nitrite: NEGATIVE
Protein, ur: 30 mg/dL — AB
Specific Gravity, Urine: 1.028 (ref 1.005–1.030)
pH: 5 (ref 5.0–8.0)

## 2021-11-17 LAB — COMPREHENSIVE METABOLIC PANEL
ALT: 22 U/L (ref 0–44)
AST: 18 U/L (ref 15–41)
Albumin: 4.3 g/dL (ref 3.5–5.0)
Alkaline Phosphatase: 61 U/L (ref 38–126)
Anion gap: 6 (ref 5–15)
BUN: 12 mg/dL (ref 6–20)
CO2: 24 mmol/L (ref 22–32)
Calcium: 8.8 mg/dL — ABNORMAL LOW (ref 8.9–10.3)
Chloride: 107 mmol/L (ref 98–111)
Creatinine, Ser: 0.89 mg/dL (ref 0.61–1.24)
GFR, Estimated: >60 mL/min (ref >60)
Glucose, Bld: 118 mg/dL — ABNORMAL HIGH (ref 70–99)
Potassium: 3.4 mmol/L — ABNORMAL LOW (ref 3.5–5.1)
Sodium: 137 mmol/L (ref 135–145)
Total Bilirubin: 0.9 mg/dL (ref 0.3–1.2)
Total Protein: 7.6 g/dL (ref 6.5–8.1)

## 2021-11-17 LAB — LIPASE, BLOOD: Lipase: 23 U/L (ref 11–51)

## 2021-11-17 LAB — LACTIC ACID, PLASMA
Lactic Acid, Venous: 1.1 mmol/L (ref 0.5–1.9)
Lactic Acid, Venous: 0.6 mmol/L (ref 0.5–1.9)

## 2021-11-17 MED ORDER — KETOROLAC TROMETHAMINE 30 MG/ML IJ SOLN
30.0000 mg | Freq: Once | INTRAMUSCULAR | Status: AC
  Administered 2021-11-17: 30 mg via INTRAVENOUS
  Filled 2021-11-17: qty 1

## 2021-11-17 MED ORDER — ACETAMINOPHEN 325 MG PO TABS
ORAL_TABLET | ORAL | Status: AC
  Administered 2021-11-17: 650 mg via ORAL
  Filled 2021-11-17: qty 2

## 2021-11-17 MED ORDER — ONDANSETRON 8 MG PO TBDP: 8.0000 mg | ORAL_TABLET | Freq: Once | ORAL | Status: AC

## 2021-11-17 MED ORDER — ONDANSETRON 4 MG PO TBDP: ORAL_TABLET | ORAL | 0 refills | Status: DC

## 2021-11-17 MED ORDER — SODIUM CHLORIDE 0.9 % IV BOLUS
2000.0000 mL | Freq: Once | INTRAVENOUS | Status: AC
  Administered 2021-11-17: 2000 mL via INTRAVENOUS

## 2021-11-17 MED ORDER — ACETAMINOPHEN 325 MG PO TABS: 650.0000 mg | ORAL_TABLET | Freq: Once | ORAL | Status: AC

## 2021-11-17 MED ORDER — ONDANSETRON 4 MG PO TBDP
ORAL_TABLET | ORAL | Status: AC
  Administered 2021-11-17: 8 mg via ORAL
  Filled 2021-11-17: qty 2

## 2021-11-17 NOTE — ED Triage Notes (Signed)
BIB family, here from home for NVD, fever, chills, body aches, and HA. Took pepto PTA. Vomited x5, diarrhea too many to count. Family with similar sx now resolved.

## 2021-11-17 NOTE — ED Provider Notes (Signed)
[facility] Provider Note   CSN: [id number] Arrival date & time: 11/17/21  1458     History {Add pertinent medical, surgical, social history, OB history to HPI:1} Chief Complaint  Patient presents with   Emesis    Nicholas Ballard. is a 31 y.o. male.  Patient complains of vomiting and diarrhea for the last couple days.  His significant other had the same similar problem.  Patient has no past medical history   Emesis     Home Medications Prior to Admission medications   Medication Sig Start Date End Date Taking? Authorizing Provider  ondansetron (ZOFRAN-ODT) 4 MG disintegrating tablet '4mg'$  ODT q4 hours prn nausea/vomit 11/17/21  Yes Milton Ferguson, MD  fluticasone [facility]) 50 MCG/ACT nasal spray Place 2 sprays into both nostrils daily. 02/24/18   [name], MD  meloxicam (MOBIC) 15 MG tablet Take 1 tablet (15 mg total) by mouth daily. 02/06/21   McDonald, Stephan Minister, DPM  orphenadrine (NORFLEX) 100 MG tablet Take 1 tablet (100 mg total) by mouth 2 (two) times daily. 06/05/20   Sable Feil, PA-C      Allergies    Patient has no known allergies.    Review of Systems   Review of Systems  Gastrointestinal:  Positive for vomiting.   Physical Exam Updated Vital Signs BP 119/70   Pulse 83   Temp 100 F (37.8 C) (Oral)   Resp 18   Ht '6\' 1"'$  (1.854 m)   Wt (!) 143.4 kg   SpO2 99%   BMI 41.71 kg/m  Physical Exam  ED Results / Procedures / Treatments   Labs (all labs ordered are listed, but only abnormal results are displayed) Labs Reviewed  CBC WITH DIFFERENTIAL/PLATELET - Abnormal; Notable for the following components:      Result Value   Lymphs Abs 0.4 (*)    All other components within normal limits  COMPREHENSIVE METABOLIC PANEL - Abnormal; Notable for the following components:   Potassium 3.4 (*)    Glucose, Bld 118 (*)    Calcium 8.8 (*)    All other components within normal limits  URINALYSIS, ROUTINE W REFLEX MICROSCOPIC -  Abnormal; Notable for the following components:   Protein, ur 30 (*)    All other components within normal limits  I-STAT CHEM 8, ED - Abnormal; Notable for the following components:   Potassium 3.4 (*)    Glucose, Bld 117 (*)    All other components within normal limits  LIPASE, BLOOD  LACTIC ACID, PLASMA  LACTIC ACID, PLASMA    EKG None  Radiology No results found.  Procedures Procedures  {Document cardiac monitor, telemetry assessment procedure when appropriate:1}  Medications Ordered in ED Medications  ondansetron (ZOFRAN-ODT) disintegrating tablet 8 mg (8 mg Oral Given 11/17/21 1537)  acetaminophen (TYLENOL) tablet 650 mg (650 mg Oral Given 11/17/21 1537)  sodium chloride 0.9 % bolus 2,000 mL (0 mLs Intravenous Stopped 11/17/21 1736)  ketorolac (TORADOL) 30 MG/ML injection 30 mg (30 mg Intravenous Given 11/17/21 1617)    ED Course/ Medical Decision Making/ A&P                           Medical Decision Making Amount and/or Complexity of Data Reviewed Labs: ordered.  Risk OTC drugs. Prescription drug management.  Patient with gastroenteritis.  He will be discharged home with Zofran and he has been hydrated with normal saline.  Patient is told to take  Motrin or Tylenol and Imodium if necessary {Document critical care time when appropriate:1} {Document review of labs and clinical decision tools ie heart score, Chads2Vasc2 etc:1}  {Document your independent review of radiology images, and any outside records:1} {Document your discussion with family members, caretakers, and with consultants:1} {Document social determinants of health affecting pt's care:1} {Document your decision making why or why not admission, treatments were needed:1} Final Clinical Impression(s) / ED Diagnoses Final diagnoses:  Gastroenteritis    Rx / DC Orders ED Discharge Orders          Ordered    ondansetron (ZOFRAN-ODT) 4 MG disintegrating tablet        11/17/21 1857

## 2021-11-17 NOTE — Discharge Instructions (Signed)
Drink plenty of fluids.  Follow-up with the stomach doctors if not getting better.  Take Imodium for persistent diarrhea and Motrin or Tylenol for pain

## 2022-05-02 ENCOUNTER — Ambulatory Visit (INDEPENDENT_AMBULATORY_CARE_PROVIDER_SITE_OTHER): Payer: 59 | Admitting: Physician Assistant

## 2022-05-02 ENCOUNTER — Encounter: Payer: Self-pay | Admitting: Physician Assistant

## 2022-05-02 VITALS — BP 129/76 | HR 64 | Resp 16 | Ht 73.0 in | Wt 312.0 lb

## 2022-05-02 DIAGNOSIS — S39012A Strain of muscle, fascia and tendon of lower back, initial encounter: Secondary | ICD-10-CM | POA: Diagnosis not present

## 2022-05-02 MED ORDER — CELECOXIB 100 MG PO CAPS: 100.0000 mg | ORAL_CAPSULE | Freq: Two times a day (BID) | ORAL | 0 refills | Status: AC

## 2022-05-02 MED ORDER — CYCLOBENZAPRINE HCL 5 MG PO TABS: 5.0000 mg | ORAL_TABLET | Freq: Three times a day (TID) | ORAL | 1 refills | Status: DC | PRN

## 2022-05-02 NOTE — Progress Notes (Signed)
I,April Miller,acting as a Education administrator for [employer], PA-C.,have documented all relevant documentation on the behalf of Nicholas Kirschner, PA-C,as directed by  Nicholas Kirschner, PA-C while in the presence of Nicholas Kirschner, PA-C.   Established patient visit   Patient: Nicholas Ballard [facility].   DOB: 01-Mar-1991   31 y.o. Male  MRN: [id number] Visit Date: 05/02/2022  Today's healthcare provider: [name], PA-C   Chief Complaint  Patient presents with   Back Pain   Subjective    HPI  Pt reports midline low back pain x 4 days. Woke up with it. He reports it is worse when he goes from sitting from a long time to standing. Denies radiating pain.. Denies injury. Walks, bends, lifts, frequently for work. Reports taking ibuprofen which helped.  Medications: Outpatient Medications Prior to Visit  Medication Sig   [DISCONTINUED] fluticasone (FLONASE) 50 MCG/ACT nasal spray Place 2 sprays into both nostrils daily. (Patient not taking: Reported on 05/02/2022)   [DISCONTINUED] meloxicam (MOBIC) 15 MG tablet Take 1 tablet (15 mg total) by mouth daily. (Patient not taking: Reported on 05/02/2022)   [DISCONTINUED] ondansetron (ZOFRAN-ODT) 4 MG disintegrating tablet '4mg'$  ODT q4 hours prn nausea/vomit (Patient not taking: Reported on 05/02/2022)   [DISCONTINUED] orphenadrine (NORFLEX) 100 MG tablet Take 1 tablet (100 mg total) by mouth 2 (two) times daily. (Patient not taking: Reported on 05/02/2022)   No facility-administered medications prior to visit.    Review of Systems  Constitutional:  Negative for appetite change, chills and fever.  Respiratory:  Negative for chest tightness, shortness of breath and wheezing.   Cardiovascular:  Negative for chest pain and palpitations.  Gastrointestinal:  Negative for abdominal pain, nausea and vomiting.  Musculoskeletal:  Positive for back pain.     Objective    BP 129/76 (BP Location: Right Arm, Patient Position: Sitting, Cuff Size: Large)   Pulse  64   Resp 16   Ht '6\' 1"'$  (1.854 m)   Wt (!) 312 lb (141.5 kg)   SpO2 98%   BMI 41.16 kg/m   Physical Exam Vitals reviewed.  Constitutional:      Appearance: He is not ill-appearing.  HENT:     Head: Normocephalic.  Eyes:     Conjunctiva/sclera: Conjunctivae normal.  Cardiovascular:     Rate and Rhythm: Normal rate.  Pulmonary:     Effort: Pulmonary effort is normal. No respiratory distress.  Musculoskeletal:     Comments: No visible rash or edema at area of pain midline low back. Nontender. Full rom.   Neurological:     General: No focal deficit present.     Mental Status: He is alert and oriented to person, place, and time.  Psychiatric:        Mood and Affect: Mood normal.        Behavior: Behavior normal.      No results found for any visits on 05/02/22.  Assessment & Plan     Low back strain Rx celebrex bid x 7 days instead of ibuprofen Rx cyclobenzaprine 5 mg up to TID, but mainly QHS for spasm advised sedating risk If no improvement to call / message office and will proceed w/ xray.  Return if symptoms worsen or fail to improve. Advised pt to schedule CPE.      I, Nicholas Kirschner, PA-C have reviewed all documentation for this visit. The documentation on  05/02/2022  for the exam, diagnosis, procedures, and orders are all accurate and complete.  Nicholas Kirschner,  PA-C [address] [place], [place], [address] Office: [phone] Fax: [phone]

## 2022-06-17 ENCOUNTER — Encounter: Payer: Self-pay | Admitting: Family Medicine

## 2022-06-17 ENCOUNTER — Ambulatory Visit (INDEPENDENT_AMBULATORY_CARE_PROVIDER_SITE_OTHER): Payer: 59 | Admitting: Family Medicine

## 2022-06-17 VITALS — BP 130/70 | HR 56 | Temp 98.2°F | Ht 73.0 in | Wt 311.6 lb

## 2022-06-17 DIAGNOSIS — R739 Hyperglycemia, unspecified: Secondary | ICD-10-CM | POA: Diagnosis not present

## 2022-06-17 DIAGNOSIS — Z8639 Personal history of other endocrine, nutritional and metabolic disease: Secondary | ICD-10-CM | POA: Diagnosis not present

## 2022-06-17 DIAGNOSIS — Z0000 Encounter for general adult medical examination without abnormal findings: Secondary | ICD-10-CM | POA: Diagnosis not present

## 2022-06-17 DIAGNOSIS — Z1159 Encounter for screening for other viral diseases: Secondary | ICD-10-CM | POA: Insufficient documentation

## 2022-06-17 DIAGNOSIS — E66813 Obesity, class 3: Secondary | ICD-10-CM | POA: Insufficient documentation

## 2022-06-17 DIAGNOSIS — Z114 Encounter for screening for human immunodeficiency virus [HIV]: Secondary | ICD-10-CM | POA: Insufficient documentation

## 2022-06-17 DIAGNOSIS — Z8579 Personal history of other malignant neoplasms of lymphoid, hematopoietic and related tissues: Secondary | ICD-10-CM

## 2022-06-17 DIAGNOSIS — D701 Agranulocytosis secondary to cancer chemotherapy: Secondary | ICD-10-CM | POA: Insufficient documentation

## 2022-06-17 NOTE — Assessment & Plan Note (Signed)
Low risk screen ?Consented; encouraged to "know your status" ?Recommend repeat screen if risk factors change ? ?

## 2022-06-17 NOTE — Assessment & Plan Note (Signed)
UTD on dental and vision No concerns with weight Borderline BP today; goal <140/<90 Married, 2 kids- twins, boy/girl 29 year old Works at [employer]; no concerns with tobacco use/abuse Things to do to keep yourself healthy  - Exercise at least 30-45 minutes a day, 3-4 days a week.  - Eat a low-fat diet with lots of fruits and vegetables, up to 7-9 servings per day.  - Seatbelts can save your life. Wear them always.  - Smoke detectors on every level of your home, check batteries every year.  - Eye Doctor - have an eye exam every 1-2 years  - Safe sex - if you may be exposed to STDs, use a condom.  - Alcohol -  If you drink, do it moderately, less than 2 drinks per day.  - [place]. Choose someone to speak for you if you are not able.  - Depression is common in our stressful world.If you're feeling down or losing interest in things you normally enjoy, please come in for a visit.  - Violence - If anyone is threatening or hurting you, please call immediately.

## 2022-06-17 NOTE — Progress Notes (Signed)
I,Connie R Striblin,acting as a Education administrator for Gwyneth Sprout, FNP.,have documented all relevant documentation on the behalf of Gwyneth Sprout, FNP,as directed by  Gwyneth Sprout, FNP while in the presence of Gwyneth Sprout, FNP.   Complete physical exam   Patient: Nicholas Ballard.   DOB: 1990/07/24   31 y.o. Male  MRN: [id number] Visit Date: 06/17/2022  Today's healthcare provider: Gwyneth Sprout, FNP  Re Introduced to nurse practitioner role and practice setting.  All questions answered.  Discussed provider/patient relationship and expectations.  Subjective    Nicholas Ballard. is a 31 y.o. male who presents today for a complete physical exam.  He reports consuming a general diet. Gym/ health club routine includes cardio. He generally feels well. He reports sleeping well. He does not have additional problems to discuss today.   HPI   Past Medical History:  Diagnosis Date   Cancer [facility])    Hodgkins Lymphoma   Neck mass    right    Past Surgical History:  Procedure Laterality Date   PERIPHERAL VASCULAR CATHETERIZATION N/A 11/09/2014   Procedure: Glori Luis Cath Insertion;  Surgeon: Algernon Huxley, MD;  Location: [place] [facility];  Service: Cardiovascular;  Laterality: N/A;   PERIPHERAL VASCULAR CATHETERIZATION N/A 07/26/2015   Procedure: Glori Luis Cath Removal;  Surgeon: Algernon Huxley, MD;  Location: [place] [facility];  Service: Cardiovascular;  Laterality: N/A;   Social History   Socioeconomic History   Marital status: Single    Spouse name: Not on file   Number of children: Not on file   Years of education: Not on file   Highest education level: Not on file  Occupational History   Not on file  Tobacco Use   Smoking status: Never   Smokeless tobacco: Never  Substance and Sexual Activity   Alcohol use: No    Comment: occasional   Drug use: No   Sexual activity: Not on file  Other Topics Concern   Not on file  Social History Narrative   Not on file   Social  Determinants of Health   Financial Resource Strain: Not on file  Food Insecurity: Not on file  Transportation Needs: Not on file  Physical Activity: Not on file  Stress: Not on file  Social Connections: Not on file  Intimate Partner Violence: Not on file   Family Status  Relation Name Status   PGF  (Not Specified)   Other  (Not Specified)   Family History  Problem Relation Age of Onset   Cancer Paternal Grandfather        head and neck cancer   Diabetes Other    Hypertension Other    Cancer Other        history of prostate and kidney cancer   No Known Allergies  Patient Care Team: Gwyneth Sprout, FNP as PCP - General (Family Medicine)   Medications: Outpatient Medications Prior to Visit  Medication Sig   [DISCONTINUED] cyclobenzaprine (FLEXERIL) 5 MG tablet Take 1 tablet (5 mg total) by mouth 3 (three) times daily as needed for muscle spasms. (Patient not taking: Reported on 06/17/2022)   No facility-administered medications prior to visit.   Review of Systems   Objective    BP 130/70 (BP Location: Left Arm, Patient Position: Sitting, Cuff Size: Large)   Pulse (!) 56   Temp 98.2 F (36.8 C) (Oral)   Ht '6\' 1"'$  (1.854 m)   Wt (!) 311  lb 9.6 oz (141.3 kg)   SpO2 100%   BMI 41.11 kg/m   Physical Exam Vitals and nursing note reviewed.  Constitutional:      General: He is awake. He is not in acute distress.    Appearance: Normal appearance. He is well-developed and well-groomed. He is obese. He is not ill-appearing, toxic-appearing or diaphoretic.  HENT:     Head: Normocephalic and atraumatic.     Jaw: There is normal jaw occlusion. No trismus, tenderness, swelling or pain on movement.     Salivary Glands: Right salivary gland is not diffusely enlarged or tender. Left salivary gland is not diffusely enlarged or tender.     Right Ear: Hearing, tympanic membrane, ear canal and external ear normal. There is no impacted cerumen.     Left Ear: Hearing, tympanic  membrane, ear canal and external ear normal. There is no impacted cerumen.     Nose: Nose normal. No congestion or rhinorrhea.     Right Turbinates: Not enlarged, swollen or pale.     Left Turbinates: Not enlarged, swollen or pale.     Right Sinus: No maxillary sinus tenderness or frontal sinus tenderness.     Left Sinus: No maxillary sinus tenderness or frontal sinus tenderness.     Mouth/Throat:     Lips: Pink.     Mouth: Mucous membranes are moist. No injury, lacerations, oral lesions or angioedema.     Pharynx: Oropharynx is clear. Uvula midline. No pharyngeal swelling, oropharyngeal exudate or posterior oropharyngeal erythema.     Tonsils: No tonsillar exudate or tonsillar abscesses.  Eyes:     General: Lids are normal. Vision grossly intact. Gaze aligned appropriately.        Right eye: No discharge.        Left eye: No discharge.     Extraocular Movements: Extraocular movements intact.     Conjunctiva/sclera: Conjunctivae normal.     Pupils: Pupils are equal, round, and reactive to light.  Neck:     Thyroid: No thyroid mass, thyromegaly or thyroid tenderness.     Vascular: No carotid bruit.     Trachea: Trachea normal. No tracheal tenderness.  Cardiovascular:     Rate and Rhythm: Normal rate and regular rhythm.     Pulses: Normal pulses.          Carotid pulses are 2+ on the right side and 2+ on the left side.      Radial pulses are 2+ on the right side and 2+ on the left side.       Femoral pulses are 2+ on the right side and 2+ on the left side.      Popliteal pulses are 2+ on the right side and 2+ on the left side.       Dorsalis pedis pulses are 2+ on the right side and 2+ on the left side.       Posterior tibial pulses are 2+ on the right side and 2+ on the left side.     Heart sounds: Normal heart sounds, S1 normal and S2 normal. No murmur heard.    No friction rub. No gallop.  Pulmonary:     Effort: Pulmonary effort is normal. No respiratory distress.     Breath  sounds: Normal breath sounds and air entry. No stridor. No wheezing, rhonchi or rales.  Chest:     Chest wall: No tenderness.  Abdominal:     General: Abdomen is flat. Bowel sounds are normal. There is  no distension.     Palpations: Abdomen is soft. There is no mass.     Tenderness: There is no abdominal tenderness. There is no guarding or rebound.     Hernia: No hernia is present.  Genitourinary:    Comments: Exam deferred; denies complaints Musculoskeletal:        General: No swelling, tenderness, deformity or signs of injury. Normal range of motion.     Cervical back: Normal range of motion and neck supple. No rigidity or tenderness.     Right lower leg: No edema.     Left lower leg: No edema.  Lymphadenopathy:     Cervical: No cervical adenopathy.     Right cervical: No superficial, deep or posterior cervical adenopathy.    Left cervical: No superficial, deep or posterior cervical adenopathy.  Skin:    General: Skin is warm and dry.     Capillary Refill: Capillary refill takes less than 2 seconds.     Coloration: Skin is not jaundiced or pale.     Findings: No bruising, erythema, lesion or rash.  Neurological:     General: No focal deficit present.     Mental Status: He is alert and oriented to person, place, and time. Mental status is at baseline.     GCS: GCS eye subscore is 4. GCS verbal subscore is 5. GCS motor subscore is 6.     Sensory: Sensation is intact. No sensory deficit.     Motor: Motor function is intact. No weakness.     Coordination: Coordination is intact.     Gait: Gait is intact.  Psychiatric:        Attention and Perception: Attention and perception normal.        Mood and Affect: Mood and affect normal.        Speech: Speech normal.        Behavior: Behavior normal. Behavior is cooperative.        Thought Content: Thought content normal.        Cognition and Memory: Cognition normal.        Judgment: Judgment normal.     Last depression screening  scores    06/17/2022   11:08 AM 05/02/2022   10:01 AM 02/24/2018    4:42 PM  PHQ 2/9 Scores  PHQ - 2 Score 0 0 5  PHQ- 9 Score 0 2 8   Last fall risk screening    06/17/2022   11:08 AM  Fall Risk   Falls in the past year? 1  Number falls in past yr: 1  Injury with Fall? 0   Last Audit-C alcohol use screening    06/17/2022   11:08 AM  Alcohol Use Disorder Test (AUDIT)  1. How often do you have a drink containing alcohol? 1  2. How many drinks containing alcohol do you have on a typical day when you are drinking? 1  3. How often do you have six or more drinks on one occasion? 0  AUDIT-C Score 2   A score of 3 or more in women, and 4 or more in men indicates increased risk for alcohol abuse, EXCEPT if all of the points are from question 1   No results found for any visits on 06/17/22.  Assessment & Plan    Routine Health Maintenance and Physical Exam  Exercise Activities and Dietary recommendations  Goals   None      There is no immunization history on file for this patient.  Health Maintenance  Topic Date Due   Hepatitis C Screening  Never done   COVID-19 Vaccine (1) 07/03/2022 (Originally 09/21/1997)   INFLUENZA VACCINE  09/21/2022 (Originally 01/21/2022)   HIV Screening  Completed   HPV VACCINES  Aged Out   DTaP/Tdap/Td  Discontinued    Discussed health benefits of physical activity, and encouraged him to engage in regular exercise appropriate for his age and condition.  Problem List Items Addressed This Visit       Other   Annual physical exam - Primary    UTD on dental and vision No concerns with weight Borderline BP today; goal <140/<90 Married, 2 kids- twins, boy/girl 68 year old Works at [employer]; no concerns with tobacco use/abuse Things to do to keep yourself healthy  - Exercise at least 30-45 minutes a day, 3-4 days a week.  - Eat a low-fat diet with lots of fruits and vegetables, up to 7-9 servings per day.  - Seatbelts can save  your life. Wear them always.  - Smoke detectors on every level of your home, check batteries every year.  - Eye Doctor - have an eye exam every 1-2 years  - Safe sex - if you may be exposed to STDs, use a condom.  - Alcohol -  If you drink, do it moderately, less than 2 drinks per day.  - [place]. Choose someone to speak for you if you are not able.  - Depression is common in our stressful world.If you're feeling down or losing interest in things you normally enjoy, please come in for a visit.  - Violence - If anyone is threatening or hurting you, please call immediately.       Relevant Orders   Comprehensive Metabolic Panel (CMET)   CBC   TSH   Lipid panel   Elevated serum glucose    Recommend DM screening Continue to recommend balanced, lower carb meals. Smaller meal size, adding snacks. Choosing water as drink of choice and increasing purposeful exercise.       Relevant Orders   Hemoglobin A1c   Encounter for hepatitis C screening test for low risk patient    Low risk screen Treatable, and curable. If left untreated Hep C can lead to cirrhosis and liver failure. Encourage routine testing; recommend repeat testing if risk factors change.       Relevant Orders   Hepatitis C Antibody   Encounter for special screening examination for HIV    Low risk screen Consented; encouraged to "know your status" Recommend repeat screen if risk factors change       Relevant Orders   HIV antibody (with reflex)   History of elevated lipids    Recommend replace LP Active at gym/cardio recommend diet low in saturated fat and regular exercise - 30 min at least 5 times per week       Relevant Orders   Lipid panel   History of nodular lymphoma    Neck Treated in 2016; no further complaints or concerns      Obesity, Class III, BMI 40-49.9 (morbid obesity) ([place])    Chronic, stable in last year; up 15# in last 2 years Body mass index is 41.11 kg/m. Discussed  importance of healthy weight management Discussed diet and exercise       Return in about 1 year (around 06/18/2023) for annual examination.    [name], FNP, have reviewed all documentation for this visit. The documentation on 06/17/22  for the exam, diagnosis, procedures, and orders are all accurate and complete.  Gwyneth Sprout, [place] [phone] (phone) [phone] (fax)  [place]

## 2022-06-17 NOTE — Assessment & Plan Note (Signed)
Neck Treated in 2016; no further complaints or concerns

## 2022-06-17 NOTE — Assessment & Plan Note (Signed)
Chronic, stable in last year; up 15# in last 2 years Body mass index is 41.11 kg/m. Discussed importance of healthy weight management Discussed diet and exercise

## 2022-06-17 NOTE — Assessment & Plan Note (Signed)
Recommend DM screening Continue to recommend balanced, lower carb meals. Smaller meal size, adding snacks. Choosing water as drink of choice and increasing purposeful exercise.

## 2022-06-17 NOTE — Assessment & Plan Note (Signed)
Low risk screen Treatable, and curable. If left untreated Hep C can lead to cirrhosis and liver failure. Encourage routine testing; recommend repeat testing if risk factors change.  

## 2022-06-17 NOTE — Assessment & Plan Note (Signed)
Recommend replace LP Active at gym/cardio recommend diet low in saturated fat and regular exercise - 30 min at least 5 times per week

## 2022-06-18 ENCOUNTER — Encounter: Payer: Self-pay | Admitting: *Deleted

## 2022-06-18 LAB — CBC
Hematocrit: 41.4 % (ref 37.5–51.0)
Hemoglobin: 13.7 g/dL (ref 13.0–17.7)
MCH: 26.7 pg (ref 26.6–33.0)
MCHC: 33.1 g/dL (ref 31.5–35.7)
MCV: 81 fL (ref 79–97)
Platelets: 193 10*3/uL (ref 150–450)
RBC: 5.14 x10E6/uL (ref 4.14–5.80)
RDW: 12.9 % (ref 11.6–15.4)
WBC: 3.5 10*3/uL (ref 3.4–10.8)

## 2022-06-18 LAB — COMPREHENSIVE METABOLIC PANEL
ALT: 21 IU/L (ref 0–44)
AST: 24 IU/L (ref 0–40)
Albumin/Globulin Ratio: 1.7 (ref 1.2–2.2)
Albumin: 4.4 g/dL (ref 4.3–5.2)
Alkaline Phosphatase: 77 IU/L (ref 44–121)
BUN/Creatinine Ratio: 12 (ref 9–20)
BUN: 13 mg/dL (ref 6–20)
Bilirubin Total: 0.5 mg/dL (ref 0.0–1.2)
CO2: 22 mmol/L (ref 20–29)
Calcium: 9.7 mg/dL (ref 8.7–10.2)
Chloride: 102 mmol/L (ref 96–106)
Creatinine, Ser: 1.1 mg/dL (ref 0.76–1.27)
Globulin, Total: 2.6 g/dL (ref 1.5–4.5)
Glucose: 87 mg/dL (ref 70–99)
Potassium: 4.4 mmol/L (ref 3.5–5.2)
Sodium: 139 mmol/L (ref 134–144)
Total Protein: 7 g/dL (ref 6.0–8.5)
eGFR: 93 mL/min/{1.73_m2} (ref >59)

## 2022-06-18 LAB — HIV ANTIBODY (ROUTINE TESTING W REFLEX): HIV Screen 4th Generation wRfx: NONREACTIVE

## 2022-06-18 LAB — HEMOGLOBIN A1C
Est. average glucose Bld gHb Est-mCnc: 128 mg/dL
Hgb A1c MFr Bld: 6.1 % — ABNORMAL HIGH (ref 4.8–5.6)

## 2022-06-18 LAB — LIPID PANEL
Chol/HDL Ratio: 4.7 ratio (ref 0.0–5.0)
Cholesterol, Total: 186 mg/dL (ref 100–199)
HDL: 40 mg/dL (ref >39)
LDL Chol Calc (NIH): 128 mg/dL — ABNORMAL HIGH (ref 0–99)
Triglycerides: 96 mg/dL (ref 0–149)
VLDL Cholesterol Cal: 18 mg/dL (ref 5–40)

## 2022-06-18 LAB — TSH: TSH: 1.76 u[IU]/mL (ref 0.450–4.500)

## 2022-06-18 LAB — HEPATITIS C ANTIBODY: Hep C Virus Ab: NONREACTIVE

## 2022-06-18 NOTE — Progress Notes (Signed)
Pre-diabetes noted on blood work. Continue to recommend balanced, lower carb meals. Smaller meal size, adding snacks. Choosing water as drink of choice and increasing purposeful exercise.  Bad/LDL cholesterol elevated. I continue to recommend diet low in saturated fat and regular exercise - 30 min at least 5 times per week  All other labs are stable.  Please let us know if you have any questions.  Thank you, Gwyneth Sprout, [address] [place], [place] [address] [phone] (phone) [phone] (fax) [place]

## 2022-08-28 ENCOUNTER — Encounter: Payer: Self-pay | Admitting: Family Medicine

## 2022-08-28 ENCOUNTER — Ambulatory Visit (INDEPENDENT_AMBULATORY_CARE_PROVIDER_SITE_OTHER): Payer: 59 | Admitting: Family Medicine

## 2022-08-28 VITALS — BP 128/77 | HR 74 | Temp 98.4°F | Ht 73.0 in | Wt 313.0 lb

## 2022-08-28 DIAGNOSIS — L03011 Cellulitis of right finger: Secondary | ICD-10-CM | POA: Insufficient documentation

## 2022-08-28 MED ORDER — MUPIROCIN 2 % EX OINT: 1.0000 | TOPICAL_OINTMENT | Freq: Two times a day (BID) | CUTANEOUS | 0 refills | Status: DC

## 2022-08-28 MED ORDER — SULFAMETHOXAZOLE-TRIMETHOPRIM 800-160 MG PO TABS: 1.0000 | ORAL_TABLET | Freq: Two times a day (BID) | ORAL | 0 refills | Status: DC

## 2022-08-28 NOTE — Assessment & Plan Note (Signed)
Denies fevers Benign appearance Abx and topical abx provided RTC 1 week if not improved

## 2022-08-28 NOTE — Progress Notes (Signed)
Established patient visit  Patient: Nicholas Ballard [facility].   DOB: [birth year]   31 y.o. Male  MRN: [id number] Visit Date: 08/28/2022  Today's healthcare provider: Gwyneth Sprout, FNP  Re Introduced to nurse practitioner role and practice setting.  All questions answered.  Discussed provider/patient relationship and expectations.  Subjective    HPI HPI   Pt stated --injury by hitting the wall..Swelling (right hand) middle finger--tender, started last night. Soak w/ epson salt.  Last edited by Elta Guadeloupe, CMA on 08/28/2022  9:31 AM.      Skin excision  Date/Time: 08/28/2022 10:13 AM  Performed by: Gwyneth Sprout, FNP Authorized by: Gwyneth Sprout, FNP   Number of Lesions: 1 Lesion 1:    Body area: upper extremity   Upper extremity location: R 3rd finger.   Initial size (mm): 2.5   Final defect size (mm): 1.5   Malignancy: malignancy unknown     Destruction method: curettage     Repair comments: The site of inflammation noted to be 2.5 cm x 2.5 cm x 0.4 cm below R middle finger nailbed was cleansed with betadine, topical pain spray applied, a small incision made with scalpel, which allowed for self drainage of infection. Infection fluid set for culture. Topical silver nitrate applied via stick to assist with hemostasis. Site dressed with antibiotic ointment and dry gauze. The patient is alerted to watch for any signs of infection (redness, pus, pain, increased swelling or fever) and call if such occurs. Home wound care instructions were provided. Tetanus vaccination status reviewed: last tetanus booster within 10 years. Denies puncture. Reports initial injury caused from jamming his finger into a doorframe.    Medications: No outpatient medications prior to visit.   No facility-administered medications prior to visit.    Review of Systems     Objective    BP 128/77 (BP Location: Left Arm, Patient Position: Sitting, Cuff Size: Large)   Pulse 74   Temp 98.4 F (36.9 C)   Ht  '6\' 1"'$  (1.854 m)   Wt (!) 313 lb (142 kg)   SpO2 98%   BMI 41.30 kg/m    Physical Exam Constitutional:      General: He is not in acute distress.    Appearance: Normal appearance. He is obese. He is not ill-appearing, toxic-appearing or diaphoretic.  Cardiovascular:     Rate and Rhythm: Normal rate.  Pulmonary:     Effort: Pulmonary effort is normal.  Skin:    Findings: Lesion present.       Neurological:     General: No focal deficit present.     Mental Status: He is alert and oriented to person, place, and time. Mental status is at baseline.  Psychiatric:        Mood and Affect: Mood normal.        Behavior: Behavior normal.        Thought Content: Thought content normal.        Judgment: Judgment normal.     No results found for any visits on 08/28/22.  Assessment & Plan     Problem List Items Addressed This Visit       Other   Abscess around nail of right middle finger - Primary    Denies fevers Benign appearance Abx and topical abx provided RTC 1 week if not improved      Relevant Orders   Wound culture   Return in about 1 week (around 09/04/2022) for skin infection .  [name], FNP, have reviewed all documentation for this visit. The documentation on 08/28/22 for the exam, diagnosis, procedures, and orders are all accurate and complete.  Gwyneth Sprout, [address] [phone] (phone) [phone] (fax)  [place]

## 2023-06-26 ENCOUNTER — Encounter: Payer: Self-pay | Admitting: Family Medicine

## 2023-06-26 ENCOUNTER — Ambulatory Visit (INDEPENDENT_AMBULATORY_CARE_PROVIDER_SITE_OTHER): Payer: 59 | Admitting: Family Medicine

## 2023-06-26 VITALS — BP 130/77 | HR 72 | Ht 73.0 in | Wt 313.0 lb

## 2023-06-26 DIAGNOSIS — Z0000 Encounter for general adult medical examination without abnormal findings: Secondary | ICD-10-CM | POA: Diagnosis not present

## 2023-06-26 DIAGNOSIS — Z8579 Personal history of other malignant neoplasms of lymphoid, hematopoietic and related tissues: Secondary | ICD-10-CM

## 2023-06-26 DIAGNOSIS — I10 Essential (primary) hypertension: Secondary | ICD-10-CM | POA: Diagnosis not present

## 2023-06-26 DIAGNOSIS — R739 Hyperglycemia, unspecified: Secondary | ICD-10-CM

## 2023-06-26 DIAGNOSIS — Z8639 Personal history of other endocrine, nutritional and metabolic disease: Secondary | ICD-10-CM

## 2023-06-26 NOTE — Assessment & Plan Note (Signed)

## 2023-06-26 NOTE — Assessment & Plan Note (Signed)
Check A1c Continue to recommend balanced, lower carb meals. Smaller meal size, adding snacks. Choosing water as drink of choice and increasing purposeful exercise.

## 2023-06-26 NOTE — Assessment & Plan Note (Signed)
 Chronic, stable Body mass index is 41.3 kg/m. Discussed importance of healthy weight management Discussed diet and exercise

## 2023-06-26 NOTE — Assessment & Plan Note (Signed)
 Historical; no current concerns CTM

## 2023-06-26 NOTE — Progress Notes (Signed)
 Complete physical exam  Patient: Nicholas Ballard [facility].   DOB: [birth year]   32 y.o. Male  MRN: [id number] Visit Date: 06/26/2023  Today's healthcare provider: Kelly ONEIDA Cedar, FNP  Introduced to nurse practitioner role and practice setting.  All questions answered.  Discussed provider/patient relationship and expectations.  Chief Complaint  Patient presents with   Annual Exam   Subjective    Nicholas Ballard Mickey. is a 33 y.o. male who presents today for a complete physical exam.  He reports consuming a general diet. The patient has a physically strenuous job, but has no regular exercise apart from work.  He generally feels fairly well. He reports sleeping fairly well. He does have additional problems to discuss today.   HPI  Here for CPE; seen at dentist 9/24.  Past Medical History:  Diagnosis Date   Cancer [facility])    Hodgkins Lymphoma   Neck mass    right    Past Surgical History:  Procedure Laterality Date   PERIPHERAL VASCULAR CATHETERIZATION N/A 11/09/2014   Procedure: Pat Cath Insertion;  Surgeon: Selinda GORMAN Gu, MD;  Location: [facility] INVASIVE CV LAB;  Service: Cardiovascular;  Laterality: N/A;   PERIPHERAL VASCULAR CATHETERIZATION N/A 07/26/2015   Procedure: Pat Cath Removal;  Surgeon: Selinda GORMAN Gu, MD;  Location: [facility] INVASIVE CV LAB;  Service: Cardiovascular;  Laterality: N/A;   Social History   Socioeconomic History   Marital status: Single    Spouse name: Not on file   Number of children: Not on file   Years of education: Not on file   Highest education level: Not on file  Occupational History   Not on file  Tobacco Use   Smoking status: Never   Smokeless tobacco: Never  Substance and Sexual Activity   Alcohol use: No    Comment: occasional   Drug use: No   Sexual activity: Not on file  Other Topics Concern   Not on file  Social History Narrative   Not on file   Social Drivers of Health   Financial Resource Strain: Not on file  Food Insecurity:  Not on file  Transportation Needs: Not on file  Physical Activity: Not on file  Stress: Not on file  Social Connections: Not on file  Intimate Partner Violence: Not on file   Family Status  Relation Name Status   PGF  (Not Specified)   Other Training And Development Officer (Mom sister) (Not Specified)  No partnership data on file   Family History  Problem Relation Age of Onset   Cancer Paternal Grandfather        head and neck cancer   Diabetes Other    Hypertension Other    Cancer Other        history of prostate and kidney cancer   No Known Allergies  Patient Care Team: Simmons-Robinson, Rockie, MD as PCP - General (Family Medicine)   Medications: Outpatient Medications Prior to Visit  Medication Sig   [DISCONTINUED] mupirocin  ointment (BACTROBAN ) 2 % Apply 1 Application topically 2 (two) times daily.   [DISCONTINUED] sulfamethoxazole -trimethoprim  (BACTRIM  DS) 800-160 MG tablet Take 1 tablet by mouth 2 (two) times daily.   No facility-administered medications prior to visit.   Last CBC Lab Results  Component Value Date   WBC 3.5 06/17/2022   HGB 13.7 06/17/2022   HCT 41.4 06/17/2022   MCV 81 06/17/2022   MCH 26.7 06/17/2022   RDW 12.9 06/17/2022   PLT 193 06/17/2022   Last metabolic panel  Lab Results  Component Value Date   GLUCOSE 87 06/17/2022   NA 139 06/17/2022   K 4.4 06/17/2022   CL 102 06/17/2022   CO2 22 06/17/2022   BUN 13 06/17/2022   CREATININE 1.10 06/17/2022   EGFR 93 06/17/2022   CALCIUM 9.7 06/17/2022   PROT 7.0 06/17/2022   ALBUMIN 4.4 06/17/2022   LABGLOB 2.6 06/17/2022   AGRATIO 1.7 06/17/2022   BILITOT 0.5 06/17/2022   ALKPHOS 77 06/17/2022   AST 24 06/17/2022   ALT 21 06/17/2022   ANIONGAP 6 11/17/2021   Last lipids Lab Results  Component Value Date   CHOL 186 06/17/2022   HDL 40 06/17/2022   LDLCALC 128 (H) 06/17/2022   TRIG 96 06/17/2022   CHOLHDL 4.7 06/17/2022   Last hemoglobin A1c Lab Results  Component Value Date   HGBA1C 6.1 (H)  06/17/2022   Last thyroid  functions Lab Results  Component Value Date   TSH 1.760 06/17/2022   Last vitamin D  No results found for: 25OHVITD2, 25OHVITD3, VD25OH Last vitamin B12 and Folate No results found for: VITAMINB12, FOLATE    Objective    BP 130/77 (BP Location: Right Arm, Patient Position: Sitting, Cuff Size: Large)   Pulse 72   Ht 6' 1 (1.854 m)   Wt (!) 313 lb (142 kg)   SpO2 98%   BMI 41.30 kg/m   BP Readings from Last 3 Encounters:  06/26/23 130/77  08/28/22 128/77  06/17/22 130/70   Wt Readings from Last 3 Encounters:  06/26/23 (!) 313 lb (142 kg)  08/28/22 (!) 313 lb (142 kg)  06/17/22 (!) 311 lb 9.6 oz (141.3 kg)   SpO2 Readings from Last 3 Encounters:  06/26/23 98%  08/28/22 98%  06/17/22 100%   Physical Exam Vitals and nursing note reviewed.  Constitutional:      General: He is awake. He is not in acute distress.    Appearance: Normal appearance. He is well-developed and well-groomed. He is obese. He is not ill-appearing, toxic-appearing or diaphoretic.  HENT:     Head: Normocephalic and atraumatic.     Jaw: There is normal jaw occlusion. No trismus, tenderness, swelling or pain on movement.     Salivary Glands: Right salivary gland is not diffusely enlarged or tender. Left salivary gland is not diffusely enlarged or tender.     Right Ear: Hearing, tympanic membrane, ear canal and external ear normal. There is no impacted cerumen.     Left Ear: Hearing, tympanic membrane, ear canal and external ear normal. There is no impacted cerumen.     Nose: Nose normal. No congestion or rhinorrhea.     Right Turbinates: Not enlarged, swollen or pale.     Left Turbinates: Not enlarged, swollen or pale.     Right Sinus: No maxillary sinus tenderness or frontal sinus tenderness.     Left Sinus: No maxillary sinus tenderness or frontal sinus tenderness.     Mouth/Throat:     Lips: Pink.     Mouth: Mucous membranes are moist. No injury, lacerations,  oral lesions or angioedema.     Pharynx: Oropharynx is clear. Uvula midline. No pharyngeal swelling, oropharyngeal exudate or posterior oropharyngeal erythema.     Tonsils: No tonsillar exudate or tonsillar abscesses.  Eyes:     General: Lids are normal. Vision grossly intact. Gaze aligned appropriately.        Right eye: No discharge.        Left eye: No discharge.  Extraocular Movements: Extraocular movements intact.     Conjunctiva/sclera: Conjunctivae normal.     Pupils: Pupils are equal, round, and reactive to light.  Neck:     Thyroid : No thyroid  mass, thyromegaly or thyroid  tenderness.     Vascular: No carotid bruit.     Trachea: Trachea normal. No tracheal tenderness.  Cardiovascular:     Rate and Rhythm: Normal rate and regular rhythm.     Pulses: Normal pulses.          Carotid pulses are 2+ on the right side and 2+ on the left side.      Radial pulses are 2+ on the right side and 2+ on the left side.       Femoral pulses are 2+ on the right side and 2+ on the left side.      Popliteal pulses are 2+ on the right side and 2+ on the left side.       Dorsalis pedis pulses are 2+ on the right side and 2+ on the left side.       Posterior tibial pulses are 2+ on the right side and 2+ on the left side.     Heart sounds: Normal heart sounds, S1 normal and S2 normal. No murmur heard.    No friction rub. No gallop.  Pulmonary:     Effort: Pulmonary effort is normal. No respiratory distress.     Breath sounds: Normal breath sounds and air entry. No stridor. No wheezing, rhonchi or rales.  Chest:     Chest wall: No tenderness.  Abdominal:     General: Abdomen is flat. Bowel sounds are normal. There is no distension.     Palpations: Abdomen is soft. There is no mass.     Tenderness: There is no abdominal tenderness. There is no guarding or rebound.     Hernia: No hernia is present.  Genitourinary:    Comments: Exam deferred; denies complaints Musculoskeletal:        General: No  swelling, tenderness, deformity or signs of injury. Normal range of motion.     Cervical back: Normal range of motion and neck supple. No rigidity or tenderness.     Right lower leg: No edema.     Left lower leg: No edema.  Lymphadenopathy:     Cervical: No cervical adenopathy.     Right cervical: No superficial, deep or posterior cervical adenopathy.    Left cervical: No superficial, deep or posterior cervical adenopathy.  Skin:    General: Skin is warm and dry.     Capillary Refill: Capillary refill takes less than 2 seconds.     Coloration: Skin is not jaundiced or pale.     Findings: No bruising, erythema, lesion or rash.  Neurological:     General: No focal deficit present.     Mental Status: He is alert and oriented to person, place, and time. Mental status is at baseline.     GCS: GCS eye subscore is 4. GCS verbal subscore is 5. GCS motor subscore is 6.     Sensory: Sensation is intact. No sensory deficit.     Motor: Motor function is intact. No weakness.     Coordination: Coordination is intact.     Gait: Gait is intact.  Psychiatric:        Attention and Perception: Attention and perception normal.        Mood and Affect: Mood and affect normal.        Speech:  Speech normal.        Behavior: Behavior normal. Behavior is cooperative.        Thought Content: Thought content normal.        Cognition and Memory: Cognition normal.        Judgment: Judgment normal.     Last depression screening scores    06/26/2023    3:49 PM 08/28/2022    9:29 AM 06/17/2022   11:08 AM  PHQ 2/9 Scores  PHQ - 2 Score 0 0 0  PHQ- 9 Score 0 0 0   Last fall risk screening    06/26/2023    3:49 PM  Fall Risk   Falls in the past year? 0  Number falls in past yr: 0  Injury with Fall? 0   Last Audit-C alcohol use screening    08/28/2022    9:28 AM  Alcohol Use Disorder Test (AUDIT)  1. How often do you have a drink containing alcohol? 1  2. How many drinks containing alcohol do you have on  a typical day when you are drinking? 0  3. How often do you have six or more drinks on one occasion? 0  AUDIT-C Score 1   A score of 3 or more in women, and 4 or more in men indicates increased risk for alcohol abuse, EXCEPT if all of the points are from question 1   No results found for any visits on 06/26/23.  Assessment & Plan    Routine Health Maintenance and Physical Exam  Exercise Activities and Dietary recommendations  Goals   None      There is no immunization history on file for this patient.  Health Maintenance  Topic Date Due   COVID-19 Vaccine (1 - 2024-25 season) Never done   INFLUENZA VACCINE  09/21/2023 (Originally 01/22/2023)   Hepatitis C Screening  Completed   HIV Screening  Completed   HPV VACCINES  Aged Out   DTaP/Tdap/Td  Discontinued    Discussed health benefits of physical activity, and encouraged him to engage in regular exercise appropriate for his age and condition.  Problem List Items Addressed This Visit       Cardiovascular and Mediastinum   Primary hypertension   Chronic, goal remains <119/79 Continue to monitor Advised of HTN, DASH diet, c/f for left ventricle hypertrophy if continued Pt declines medication at this time and plans to work on lifestyle changes         Other   Annual physical exam - Primary   Things to do to keep yourself healthy  - Exercise at least 30-45 minutes a day, 3-4 days a week.  - Eat a low-fat diet with lots of fruits and vegetables, up to 7-9 servings per day.  - Seatbelts can save your life. Wear them always.  - Smoke detectors on every level of your home, check batteries every year.  - Eye Doctor - have an eye exam every 1-2 years  - Safe sex - if you may be exposed to STDs, use a condom.  - Alcohol -  If you drink, do it moderately, less than 2 drinks per day.  - Health Care Power of Attorney. Choose someone to speak for you if you are not able.  - Depression is common in our stressful world.If you're  feeling down or losing interest in things you normally enjoy, please come in for a visit.  - Violence - If anyone is threatening or hurting you, please call immediately.  Relevant Orders   CBC with Differential/Platelet   Comprehensive Metabolic Panel (CMET)   TSH   Hemoglobin A1c   Vitamin D  (25 hydroxy)   Lipid panel   Urine Microalbumin w/creat. ratio   Elevated serum glucose   Check A1c Continue to recommend balanced, lower carb meals. Smaller meal size, adding snacks. Choosing water as drink of choice and increasing purposeful exercise.       History of elevated lipids   Chronic, repeat LP recommend diet low in saturated fat and regular exercise - 30 min at least 5 times per week       History of nodular lymphoma   Historical; no current concerns CTM      Obesity, Class III, BMI 40-49.9 (morbid obesity) (HCC)   Chronic, stable Body mass index is 41.3 kg/m. Discussed importance of healthy weight management Discussed diet and exercise       No follow-ups on file.    LILLETTE Kelly ONEIDA Emilio, FNP, have reviewed all documentation for this visit. The documentation on 06/26/23 for the exam, diagnosis, procedures, and orders are all accurate and complete.  Kelly ONEIDA Emilio, FNP  [facility] [phone] (phone) [phone] (fax)  [facility]

## 2023-06-26 NOTE — Assessment & Plan Note (Signed)
 Chronic, goal remains <119/79 Continue to monitor Advised of HTN, DASH diet, c/f for left ventricle hypertrophy if continued Pt declines medication at this time and plans to work on lifestyle changes

## 2023-06-26 NOTE — Assessment & Plan Note (Signed)
Chronic, repeat LP recommend diet low in saturated fat and regular exercise - 30 min at least 5 times per week

## 2023-06-27 LAB — CBC WITH DIFFERENTIAL/PLATELET
Basophils Absolute: 0 10*3/uL (ref 0.0–0.2)
Basos: 0 %
EOS (ABSOLUTE): 0.1 10*3/uL (ref 0.0–0.4)
Eos: 2 %
Hematocrit: 40.5 % (ref 37.5–51.0)
Hemoglobin: 13.2 g/dL (ref 13.0–17.7)
Immature Grans (Abs): 0 10*3/uL (ref 0.0–0.1)
Immature Granulocytes: 0 %
Lymphocytes Absolute: 1.4 10*3/uL (ref 0.7–3.1)
Lymphs: 29 %
MCH: 26.7 pg (ref 26.6–33.0)
MCHC: 32.6 g/dL (ref 31.5–35.7)
MCV: 82 fL (ref 79–97)
Monocytes Absolute: 0.3 10*3/uL (ref 0.1–0.9)
Monocytes: 6 %
Neutrophils Absolute: 2.9 10*3/uL (ref 1.4–7.0)
Neutrophils: 63 %
Platelets: 214 10*3/uL (ref 150–450)
RBC: 4.95 x10E6/uL (ref 4.14–5.80)
RDW: 14.1 % (ref 11.6–15.4)
WBC: 4.7 10*3/uL (ref 3.4–10.8)

## 2023-06-27 LAB — COMPREHENSIVE METABOLIC PANEL
ALT: 19 [IU]/L (ref 0–44)
AST: 16 [IU]/L (ref 0–40)
Albumin: 4.7 g/dL (ref 4.3–5.2)
Alkaline Phosphatase: 78 [IU]/L (ref 44–121)
BUN/Creatinine Ratio: 14 (ref 9–20)
BUN: 17 mg/dL (ref 6–20)
Bilirubin Total: 0.4 mg/dL (ref 0.0–1.2)
CO2: 24 mmol/L (ref 20–29)
Calcium: 9.4 mg/dL (ref 8.7–10.2)
Chloride: 103 mmol/L (ref 96–106)
Creatinine, Ser: 1.21 mg/dL (ref 0.76–1.27)
Globulin, Total: 2.4 g/dL (ref 1.5–4.5)
Glucose: 78 mg/dL (ref 70–99)
Potassium: 4.6 mmol/L (ref 3.5–5.2)
Sodium: 143 mmol/L (ref 134–144)
Total Protein: 7.1 g/dL (ref 6.0–8.5)
eGFR: 83 mL/min/{1.73_m2} (ref >59)

## 2023-06-27 LAB — TSH: TSH: 2.39 u[IU]/mL (ref 0.450–4.500)

## 2023-06-27 LAB — LIPID PANEL
Chol/HDL Ratio: 5.1 {ratio} — ABNORMAL HIGH (ref 0.0–5.0)
Cholesterol, Total: 219 mg/dL — ABNORMAL HIGH (ref 100–199)
HDL: 43 mg/dL (ref >39)
LDL Chol Calc (NIH): 156 mg/dL — ABNORMAL HIGH (ref 0–99)
Triglycerides: 112 mg/dL (ref 0–149)
VLDL Cholesterol Cal: 20 mg/dL (ref 5–40)

## 2023-06-27 LAB — MICROALBUMIN / CREATININE URINE RATIO
Creatinine, Urine: 284.3 mg/dL
Microalb/Creat Ratio: 3 mg/g{creat} (ref 0–29)
Microalbumin, Urine: 8.5 ug/mL

## 2023-06-27 LAB — HEMOGLOBIN A1C
Est. average glucose Bld gHb Est-mCnc: 120 mg/dL
Hgb A1c MFr Bld: 5.8 % — ABNORMAL HIGH (ref 4.8–5.6)

## 2023-06-27 LAB — VITAMIN D 25 HYDROXY (VIT D DEFICIENCY, FRACTURES): Vit D, 25-Hydroxy: 15 ng/mL — ABNORMAL LOW (ref 30.0–100.0)

## 2024-07-11 ENCOUNTER — Encounter: Admitting: Family Medicine

## 2024-09-15 ENCOUNTER — Encounter: Admitting: Family Medicine
